# Patient Record
Sex: Female | Born: 1965 | Race: White | Hispanic: No | Marital: Single | Smoking: Never smoker
Health system: Southern US, Community
[De-identification: ages and names within clinical notes are randomized; demographics above are authoritative.]

## PROBLEM LIST (undated history)

## (undated) DIAGNOSIS — E119 Type 2 diabetes mellitus without complications: Secondary | ICD-10-CM

## (undated) DIAGNOSIS — F419 Anxiety disorder, unspecified: Secondary | ICD-10-CM

## (undated) HISTORY — PX: APPENDECTOMY: SHX54

## (undated) HISTORY — PX: ABDOMINAL HYSTERECTOMY: SHX81

## (undated) HISTORY — PX: TONSILLECTOMY: SUR1361

---

## 2004-02-24 ENCOUNTER — Emergency Department: Payer: Self-pay | Admitting: Internal Medicine

## 2004-03-10 ENCOUNTER — Ambulatory Visit: Payer: Self-pay | Admitting: Family Medicine

## 2004-04-04 ENCOUNTER — Inpatient Hospital Stay: Payer: Self-pay | Admitting: Obstetrics and Gynecology

## 2005-01-12 ENCOUNTER — Emergency Department: Payer: Self-pay | Admitting: Emergency Medicine

## 2005-07-06 ENCOUNTER — Emergency Department: Payer: Self-pay | Admitting: Emergency Medicine

## 2005-08-20 ENCOUNTER — Emergency Department: Payer: Self-pay | Admitting: Emergency Medicine

## 2006-10-17 ENCOUNTER — Emergency Department: Payer: Self-pay | Admitting: General Practice

## 2007-06-18 ENCOUNTER — Emergency Department: Payer: Self-pay | Admitting: Emergency Medicine

## 2007-09-11 ENCOUNTER — Emergency Department: Payer: Self-pay | Admitting: Emergency Medicine

## 2007-12-08 ENCOUNTER — Emergency Department: Payer: Self-pay | Admitting: Emergency Medicine

## 2008-08-01 DIAGNOSIS — R0989 Other specified symptoms and signs involving the circulatory and respiratory systems: Secondary | ICD-10-CM | POA: Insufficient documentation

## 2008-08-01 DIAGNOSIS — F411 Generalized anxiety disorder: Secondary | ICD-10-CM | POA: Insufficient documentation

## 2008-08-01 DIAGNOSIS — E669 Obesity, unspecified: Secondary | ICD-10-CM | POA: Insufficient documentation

## 2008-08-03 ENCOUNTER — Emergency Department: Payer: Self-pay | Admitting: Emergency Medicine

## 2009-03-15 ENCOUNTER — Emergency Department: Payer: Self-pay | Admitting: Emergency Medicine

## 2009-05-26 ENCOUNTER — Emergency Department: Payer: Self-pay | Admitting: Emergency Medicine

## 2010-03-17 ENCOUNTER — Emergency Department: Payer: Self-pay | Admitting: Emergency Medicine

## 2011-03-12 ENCOUNTER — Emergency Department: Payer: Self-pay | Admitting: *Deleted

## 2011-08-19 ENCOUNTER — Emergency Department: Payer: Self-pay | Admitting: Emergency Medicine

## 2011-08-19 LAB — TROPONIN I
Troponin-I: <0.02 ng/mL
Troponin-I: <0.02 ng/mL

## 2011-08-19 LAB — BASIC METABOLIC PANEL
BUN: 11 mg/dL (ref 7–18)
Calcium, Total: 8.3 mg/dL — ABNORMAL LOW (ref 8.5–10.1)
Chloride: 103 mmol/L (ref 98–107)
Co2: 30 mmol/L (ref 21–32)
Creatinine: 0.81 mg/dL (ref 0.60–1.30)
EGFR (African American): >60
Glucose: 87 mg/dL (ref 65–99)
Potassium: 3.8 mmol/L (ref 3.5–5.1)
Sodium: 142 mmol/L (ref 136–145)

## 2011-08-19 LAB — CBC
HGB: 13.2 g/dL (ref 12.0–16.0)
MCH: 31.8 pg (ref 26.0–34.0)
MCV: 95 fL (ref 80–100)
Platelet: 342 10*3/uL (ref 150–440)
RBC: 4.15 10*6/uL (ref 3.80–5.20)
RDW: 13.5 % (ref 11.5–14.5)
WBC: 8.4 10*3/uL (ref 3.6–11.0)

## 2013-01-13 ENCOUNTER — Emergency Department: Payer: Self-pay | Admitting: Emergency Medicine

## 2013-10-10 ENCOUNTER — Emergency Department: Payer: Self-pay | Admitting: Emergency Medicine

## 2013-10-13 LAB — BETA STREP CULTURE(ARMC)

## 2014-06-25 ENCOUNTER — Emergency Department: Payer: Self-pay | Admitting: Emergency Medicine

## 2014-08-11 ENCOUNTER — Emergency Department
Admit: 2014-08-11 | Disposition: A | Discharge status: Home / Self Care | Payer: Self-pay | Admitting: Emergency Medicine

## 2014-08-11 LAB — URINALYSIS, COMPLETE
Bilirubin,UR: NEGATIVE
GLUCOSE, UR: NEGATIVE mg/dL (ref 0–75)
Hyaline Cast: 2
Ketone: NEGATIVE
NITRITE: NEGATIVE
Ph: 5 (ref 4.5–8.0)
Protein: NEGATIVE
Specific Gravity: 1.02 (ref 1.003–1.030)
Squamous Epithelial: 26
WBC UR: 15 /HPF (ref 0–5)

## 2014-08-11 LAB — COMPREHENSIVE METABOLIC PANEL
ALBUMIN: 3.8 g/dL
ALK PHOS: 69 U/L
Anion Gap: 10 (ref 7–16)
BUN: 8 mg/dL
Bilirubin,Total: 0.3 mg/dL
CREATININE: 0.82 mg/dL
Calcium, Total: 8.5 mg/dL — ABNORMAL LOW
Chloride: 103 mmol/L
Co2: 26 mmol/L
EGFR (African American): >60
Glucose: 106 mg/dL — ABNORMAL HIGH
POTASSIUM: 3.7 mmol/L
SGOT(AST): 36 U/L
SGPT (ALT): 31 U/L
Sodium: 139 mmol/L
Total Protein: 7.3 g/dL

## 2014-08-11 LAB — CBC WITH DIFFERENTIAL/PLATELET
Basophil #: 0 10*3/uL (ref 0.0–0.1)
Basophil %: 0.5 %
Eosinophil #: 0.1 10*3/uL (ref 0.0–0.7)
Eosinophil %: 1.9 %
HCT: 42.5 % (ref 35.0–47.0)
HGB: 13.6 g/dL (ref 12.0–16.0)
LYMPHS PCT: 33.2 %
Lymphocyte #: 2.5 10*3/uL (ref 1.0–3.6)
MCH: 30.4 pg (ref 26.0–34.0)
MCHC: 31.9 g/dL — ABNORMAL LOW (ref 32.0–36.0)
MCV: 95 fL (ref 80–100)
MONOS PCT: 7.7 %
Monocyte #: 0.6 x10 3/mm (ref 0.2–0.9)
NEUTROS ABS: 4.3 10*3/uL (ref 1.4–6.5)
Neutrophil %: 56.7 %
Platelet: 391 10*3/uL (ref 150–440)
RBC: 4.46 10*6/uL (ref 3.80–5.20)
RDW: 13.8 % (ref 11.5–14.5)
WBC: 7.5 10*3/uL (ref 3.6–11.0)

## 2014-08-11 LAB — LIPASE, BLOOD: LIPASE: 53 U/L — AB

## 2014-08-11 LAB — TROPONIN I: Troponin-I: <0.03 ng/mL

## 2014-09-02 ENCOUNTER — Emergency Department
Admit: 2014-09-02 | Disposition: A | Discharge status: Home / Self Care | Payer: Self-pay | Admitting: Emergency Medicine

## 2015-01-30 ENCOUNTER — Encounter: Payer: Self-pay | Admitting: Emergency Medicine

## 2015-01-30 ENCOUNTER — Emergency Department
Admission: EM | Admit: 2015-01-30 | Discharge: 2015-01-30 | Disposition: A | Discharge status: Home / Self Care | Payer: BLUE CROSS/BLUE SHIELD | Attending: Emergency Medicine | Admitting: Emergency Medicine

## 2015-01-30 DIAGNOSIS — R002 Palpitations: Secondary | ICD-10-CM | POA: Insufficient documentation

## 2015-01-30 HISTORY — DX: Anxiety disorder, unspecified: F41.9

## 2015-01-30 LAB — COMPREHENSIVE METABOLIC PANEL
ALBUMIN: 3.6 g/dL (ref 3.5–5.0)
ALK PHOS: 62 U/L (ref 38–126)
ALT: 24 U/L (ref 14–54)
ANION GAP: 5 (ref 5–15)
AST: 28 U/L (ref 15–41)
BUN: 12 mg/dL (ref 6–20)
CALCIUM: 8.4 mg/dL — AB (ref 8.9–10.3)
CHLORIDE: 105 mmol/L (ref 101–111)
CO2: 28 mmol/L (ref 22–32)
Creatinine, Ser: 0.62 mg/dL (ref 0.44–1.00)
GFR calc non Af Amer: >60 mL/min (ref >60)
Glucose, Bld: 124 mg/dL — ABNORMAL HIGH (ref 65–99)
POTASSIUM: 3.7 mmol/L (ref 3.5–5.1)
Sodium: 138 mmol/L (ref 135–145)
Total Bilirubin: 0.3 mg/dL (ref 0.3–1.2)
Total Protein: 6.7 g/dL (ref 6.5–8.1)

## 2015-01-30 LAB — CBC
HCT: 37.5 % (ref 35.0–47.0)
Hemoglobin: 12.8 g/dL (ref 12.0–16.0)
MCH: 32 pg (ref 26.0–34.0)
MCHC: 34 g/dL (ref 32.0–36.0)
MCV: 94 fL (ref 80.0–100.0)
PLATELETS: 356 10*3/uL (ref 150–440)
RBC: 3.99 MIL/uL (ref 3.80–5.20)
RDW: 13.6 % (ref 11.5–14.5)
WBC: 12 10*3/uL — ABNORMAL HIGH (ref 3.6–11.0)

## 2015-01-30 LAB — TROPONIN I: Troponin I: <0.03 ng/mL (ref <0.031)

## 2015-01-30 NOTE — Discharge Instructions (Signed)
Please seek medical attention for any high fevers, chest pain, shortness of breath, change in behavior, persistent vomiting, bloody stool or any other new or concerning symptoms.   Palpitations A palpitation is the feeling that your heartbeat is irregular or is faster than normal. It may feel like your heart is fluttering or skipping a beat. Palpitations are usually not a serious problem. However, in some cases, you may need further medical evaluation. CAUSES  Palpitations can be caused by:  Smoking.  Caffeine or other stimulants, such as diet pills or energy drinks.  Alcohol.  Stress and anxiety.  Strenuous physical activity.  Fatigue.  Certain medicines.  Heart disease, especially if you have a history of irregular heart rhythms (arrhythmias), such as atrial fibrillation, atrial flutter, or supraventricular tachycardia.  An improperly working pacemaker or defibrillator. DIAGNOSIS  To find the cause of your palpitations, your health care provider will take your medical history and perform a physical exam. Your health care provider may also have you take a test called an ambulatory electrocardiogram (ECG). An ECG records your heartbeat patterns over a 24-hour period. You may also have other tests, such as:  Transthoracic echocardiogram (TTE). During echocardiography, sound waves are used to evaluate how blood flows through your heart.  Transesophageal echocardiogram (TEE).  Cardiac monitoring. This allows your health care provider to monitor your heart rate and rhythm in real time.  Holter monitor. This is a portable device that records your heartbeat and can help diagnose heart arrhythmias. It allows your health care provider to track your heart activity for several days, if needed.  Stress tests by exercise or by giving medicine that makes the heart beat faster. TREATMENT  Treatment of palpitations depends on the cause of your symptoms and can vary greatly. Most cases of  palpitations do not require any treatment other than time, relaxation, and monitoring your symptoms. Other causes, such as atrial fibrillation, atrial flutter, or supraventricular tachycardia, usually require further treatment. HOME CARE INSTRUCTIONS   Avoid:  Caffeinated coffee, tea, soft drinks, diet pills, and energy drinks.  Chocolate.  Alcohol.  Stop smoking if you smoke.  Reduce your stress and anxiety. Things that can help you relax include:  A method of controlling things in your body, such as your heartbeats, with your mind (biofeedback).  Yoga.  Meditation.  Physical activity such as swimming, jogging, or walking.  Get plenty of rest and sleep. SEEK MEDICAL CARE IF:   You continue to have a fast or irregular heartbeat beyond 24 hours.  Your palpitations occur more often. SEEK IMMEDIATE MEDICAL CARE IF:  You have chest pain or shortness of breath.  You have a severe headache.  You feel dizzy or you faint. MAKE SURE YOU:  Understand these instructions.  Will watch your condition.  Will get help right away if you are not doing well or get worse. Document Released: 04/21/2000 Document Revised: 04/29/2013 Document Reviewed: 06/23/2011 [facility] Patient Information 2015 [place], [place]. This information is not intended to replace advice given to you by your health care provider. Make sure you discuss any questions you have with your health care provider.

## 2015-01-30 NOTE — ED Notes (Signed)
States sensation of fluttering in chest x 1 week, denies chest pain, states intermittent SOB and dizziness

## 2015-01-30 NOTE — ED Provider Notes (Signed)
[facility] Emergency Department Provider Note    ____________________________________________  Time seen: 1655  I have reviewed the triage vital signs and the nursing notes.   HISTORY  Chief Complaint Palpitations   History limited by: Not Limited   HPI Teresa Newman is a 49 y.o. female who presents to the emergency department today because of concerns for fluttering in her chest. She states the symptoms have been going on for 1 week. She states that the sensation is a fluttering. It sometimes goes up into her neck. It only lasts 2 or 3 seconds. She does not have any associated chest pain. No inciting or alleviating factors that the patient is been able to identify. Patient states she has never had symptoms like this in the past. She denies any recent illnesses, fevers, nausea or vomiting.    Past Medical History  Diagnosis Date  . Anxiety disorder     There are no active problems to display for this patient.   Past Surgical History  Procedure Laterality Date  . Appendectomy    . Tonsillectomy    . Abdominal hysterectomy      No current outpatient prescriptions on file.  Allergies Review of patient's allergies indicates no known allergies.  No family history on file.  Social History Social History  Substance Use Topics  . Smoking status: Never Smoker   . Smokeless tobacco: None  . Alcohol Use: No    Review of Systems  Constitutional: Negative for fever. Cardiovascular: Negative for chest pain.positive for heart fluttering Respiratory: Negative for shortness of breath. Gastrointestinal: Negative for abdominal pain, vomiting and diarrhea. Genitourinary: Negative for dysuria. Musculoskeletal: Negative for back pain. Skin: Negative for rash. Neurological: Negative for headaches, focal weakness or numbness.  10-point ROS otherwise negative.  ____________________________________________   PHYSICAL EXAM:  VITAL SIGNS: ED  Triage Vitals  Enc Vitals Group     BP 01/30/15 1434 134/64 mmHg     Pulse Rate 01/30/15 1434 82     Resp 01/30/15 1434 18     Temp 01/30/15 1434 97.9 F (36.6 C)     Temp Source 01/30/15 1434 Oral     SpO2 01/30/15 1434 99 %     Weight 01/30/15 1434 240 lb (108.863 kg)     Height 01/30/15 1434  (1.803 m)   Constitutional: Alert and oriented. Well appearing and in no distress. Eyes: Conjunctivae are normal. PERRL. Normal extraocular movements. ENT   Head: Normocephalic and atraumatic.   Nose: No congestion/rhinnorhea.   Mouth/Throat: Mucous membranes are moist.   Neck: No stridor. Hematological/Lymphatic/Immunilogical: No cervical lymphadenopathy. Cardiovascular: Normal rate, regular rhythm.  No murmurs, rubs, or gallops. Respiratory: Normal respiratory effort without tachypnea nor retractions. Breath sounds are clear and equal bilaterally. No wheezes/rales/rhonchi. Gastrointestinal: Soft and nontender. No distention. Genitourinary: Deferred Musculoskeletal: Normal range of motion in all extremities.  Neurologic:  Normal speech and language. No gross focal neurologic deficits are appreciated. Speech is normal.  Skin:  Skin is warm, dry and intact. No rash noted. Psychiatric: Mood and affect are normal. Speech and behavior are normal. Patient exhibits appropriate insight and judgment.  ____________________________________________    LABS (pertinent positives/negatives)  Labs Reviewed  CBC - Abnormal; Notable for the following:    WBC 12.0 (*)    All other components within normal limits  COMPREHENSIVE METABOLIC PANEL - Abnormal; Notable for the following:    Glucose, Bld 124 (*)    Calcium 8.4 (*)    All other components  within normal limits  TROPONIN I     ____________________________________________   EKG  I, Phineas Semen, attending physician, personally viewed and interpreted this EKG  EKG Time: 1438 Rate: 81 Rhythm: normal sinus  rhythm Axis: normal Intervals: normal QRS: normal ST changes: no st elevation Impression: Normal EKG   ____________________________________________    RADIOLOGY  None   ____________________________________________   PROCEDURES  Procedure(s) performed: None  Critical Care performed: No  ____________________________________________   INITIAL IMPRESSION / ASSESSMENT AND PLAN / ED COURSE  Pertinent labs & imaging results that were available during my care of the patient were reviewed by me and considered in my medical decision making (see chart for details).  Patient presented to the emergency department today with concerns for heart fluttering and palpitations. EKG without any concerning findings. Additionally blood work without concerning findings. Physical exam without any murmurs. This point unclear etiology patient's symptoms however I feel like PVCs most likely given the very brief nature. I think paroxysmal A. Fib is less likely, however I did discuss with the patient the importance of following up with primary care cardiology for possible Holter monitor. I also discussed return precautions with the patient.  ____________________________________________   FINAL CLINICAL IMPRESSION(S) / ED DIAGNOSES  palpitations  Phineas Semen, MD 01/30/15 [phone]

## 2015-06-02 ENCOUNTER — Encounter: Payer: Self-pay | Admitting: *Deleted

## 2015-06-02 ENCOUNTER — Emergency Department
Admission: EM | Admit: 2015-06-02 | Discharge: 2015-06-02 | Disposition: A | Discharge status: Home / Self Care | Payer: BLUE CROSS/BLUE SHIELD | Attending: Emergency Medicine | Admitting: Emergency Medicine

## 2015-06-02 DIAGNOSIS — Z79899 Other long term (current) drug therapy: Secondary | ICD-10-CM | POA: Diagnosis not present

## 2015-06-02 DIAGNOSIS — R197 Diarrhea, unspecified: Secondary | ICD-10-CM | POA: Insufficient documentation

## 2015-06-02 DIAGNOSIS — N3090 Cystitis, unspecified without hematuria: Secondary | ICD-10-CM | POA: Diagnosis not present

## 2015-06-02 DIAGNOSIS — R1030 Lower abdominal pain, unspecified: Secondary | ICD-10-CM | POA: Diagnosis present

## 2015-06-02 LAB — COMPREHENSIVE METABOLIC PANEL
ALT: 30 U/L (ref 14–54)
AST: 23 U/L (ref 15–41)
Albumin: 3.9 g/dL (ref 3.5–5.0)
Alkaline Phosphatase: 67 U/L (ref 38–126)
Anion gap: 9 (ref 5–15)
BILIRUBIN TOTAL: 0.8 mg/dL (ref 0.3–1.2)
BUN: 8 mg/dL (ref 6–20)
CO2: 28 mmol/L (ref 22–32)
Calcium: 9 mg/dL (ref 8.9–10.3)
Chloride: 103 mmol/L (ref 101–111)
Creatinine, Ser: 0.76 mg/dL (ref 0.44–1.00)
GFR calc Af Amer: >60 mL/min (ref >60)
Glucose, Bld: 103 mg/dL — ABNORMAL HIGH (ref 65–99)
POTASSIUM: 3.6 mmol/L (ref 3.5–5.1)
Sodium: 140 mmol/L (ref 135–145)
TOTAL PROTEIN: 7.5 g/dL (ref 6.5–8.1)

## 2015-06-02 LAB — CBC
HEMATOCRIT: 41 % (ref 35.0–47.0)
Hemoglobin: 13.4 g/dL (ref 12.0–16.0)
MCH: 31.1 pg (ref 26.0–34.0)
MCHC: 32.7 g/dL (ref 32.0–36.0)
MCV: 95 fL (ref 80.0–100.0)
Platelets: 353 10*3/uL (ref 150–440)
RBC: 4.31 MIL/uL (ref 3.80–5.20)
RDW: 13.7 % (ref 11.5–14.5)
WBC: 14.9 10*3/uL — AB (ref 3.6–11.0)

## 2015-06-02 LAB — URINALYSIS COMPLETE WITH MICROSCOPIC (ARMC ONLY)
BACTERIA UA: NONE SEEN
Bilirubin Urine: NEGATIVE
Glucose, UA: NEGATIVE mg/dL
HGB URINE DIPSTICK: NEGATIVE
Ketones, ur: NEGATIVE mg/dL
NITRITE: NEGATIVE
PH: 6 (ref 5.0–8.0)
Protein, ur: NEGATIVE mg/dL
Specific Gravity, Urine: 1.017 (ref 1.005–1.030)

## 2015-06-02 LAB — LIPASE, BLOOD: LIPASE: 35 U/L (ref 11–51)

## 2015-06-02 MED ORDER — ONDANSETRON 8 MG PO TBDP: 8.0000 mg | ORAL_TABLET | Freq: Three times a day (TID) | ORAL | Status: DC | PRN

## 2015-06-02 MED ORDER — DIPHENHYDRAMINE HCL 25 MG PO CAPS
ORAL_CAPSULE | ORAL | Status: AC
  Administered 2015-06-02: 25 mg via ORAL
  Filled 2015-06-02: qty 1

## 2015-06-02 MED ORDER — METOCLOPRAMIDE HCL 10 MG PO TABS
10.0000 mg | ORAL_TABLET | Freq: Once | ORAL | Status: AC
  Administered 2015-06-02: 10 mg via ORAL
  Filled 2015-06-02: qty 1

## 2015-06-02 MED ORDER — CEPHALEXIN 500 MG PO CAPS: 500.0000 mg | ORAL_CAPSULE | Freq: Three times a day (TID) | ORAL | Status: DC

## 2015-06-02 MED ORDER — DIPHENHYDRAMINE HCL 25 MG PO CAPS
25.0000 mg | ORAL_CAPSULE | Freq: Once | ORAL | Status: AC
  Administered 2015-06-02: 25 mg via ORAL

## 2015-06-02 NOTE — Discharge Instructions (Signed)

## 2015-06-02 NOTE — ED Notes (Signed)
Pt to triage via wheelchair.  Pt has abd pain for 2 days.  No n/v/d.  Pt reports back pain.  No dysuria.  No vag bleeding/discharge.  Pt alert.

## 2015-06-02 NOTE — ED Provider Notes (Signed)
[facility] Emergency Department Provider Note  ____________________________________________  Time seen: 5:35 PM  I have reviewed the triage vital signs and the nursing notes.   HISTORY  Chief Complaint Abdominal Pain    HPI Teresa Newman is a 50 y.o. female who complains of lower abdominal pain with nausea chills urinary frequency dysuria and urgency for the past 2 days. No back pain. No trauma. Normal oral intake.     Past Medical History  Diagnosis Date  . Anxiety disorder      There are no active problems to display for this patient.    Past Surgical History  Procedure Laterality Date  . Appendectomy    . Tonsillectomy    . Abdominal hysterectomy       Current Outpatient Rx  Name  Route  Sig  Dispense  Refill  . cephALEXin (KEFLEX) 500 MG capsule   Oral   Take 1 capsule (500 mg total) by mouth 3 (three) times daily.   21 capsule   0   . ondansetron (ZOFRAN ODT) 8 MG disintegrating tablet   Oral   Take 1 tablet (8 mg total) by mouth every 8 (eight) hours as needed for nausea or vomiting.   20 tablet   0   . PARoxetine (PAXIL) 40 MG tablet   Oral   Take 40 mg by mouth daily.            Allergies Review of patient's allergies indicates no known allergies.   No family history on file.  Social History Social History  Substance Use Topics  . Smoking status: Never Smoker   . Smokeless tobacco: None  . Alcohol Use: No    Review of Systems  Constitutional:   No fever or chills. No weight changes Eyes:   No blurry vision or double vision.  ENT:   No sore throat. Cardiovascular:   No chest pain. Respiratory:   No dyspnea or cough. Gastrointestinal:   Positive as above for abdominal pain, vomiting and diarrhea.  No BRBPR or melena. Genitourinary:   Positive as above dysuria with urgency and frequency. No Hematuria. Musculoskeletal:   Negative for back pain. No joint swelling or pain. Skin:   Negative for  rash. Neurological:   Negative for headaches, focal weakness or numbness. Psychiatric:  No anxiety or depression.   Endocrine:  No hot/cold intolerance, changes in energy, or sleep difficulty.  10-point ROS otherwise negative.  ____________________________________________   PHYSICAL EXAM:  VITAL SIGNS: ED Triage Vitals  Enc Vitals Group     BP 06/02/15 1641 124/64 mmHg     Pulse Rate 06/02/15 1641 75     Resp 06/02/15 1641 20     Temp 06/02/15 1641 97.9 F (36.6 C)     Temp Source 06/02/15 1641 Oral     SpO2 06/02/15 1641 98 %     Weight 06/02/15 1641 241 lb (109.317 kg)     Height 06/02/15 1641  (1.803 m)     Head Cir --      Peak Flow --      Pain Score 06/02/15 1642 10     Pain Loc --      Pain Edu? --      Excl. in GC? --     Vital signs reviewed, nursing assessments reviewed.   Constitutional:   Alert and oriented. Well appearing and in no distress. Eyes:   No scleral icterus. No conjunctival pallor. PERRL. EOMI ENT   Head:  Normocephalic and atraumatic.   Nose:   No congestion/rhinnorhea. No septal hematoma   Mouth/Throat:   MMM, no pharyngeal erythema. No peritonsillar mass. No uvula shift.   Neck:   No stridor. No SubQ emphysema. No meningismus. Hematological/Lymphatic/Immunilogical:   No cervical lymphadenopathy. Cardiovascular:   RRR. Normal and symmetric distal pulses are present in all extremities. No murmurs, rubs, or gallops. Respiratory:   Normal respiratory effort without tachypnea nor retractions. Breath sounds are clear and equal bilaterally. No wheezes/rales/rhonchi. Gastrointestinal:   Soft with suprapubic tenderness. No distention. There is no CVA tenderness.  No rebound, rigidity, or guarding. Genitourinary:   deferred Musculoskeletal:   Nontender with normal range of motion in all extremities. No joint effusions.  No lower extremity tenderness.  No edema. Neurologic:   Normal speech and language.  CN 2-10 normal. Motor  grossly intact. No pronator drift.  Normal gait. No gross focal neurologic deficits are appreciated.  Skin:    Skin is warm, dry and intact. No rash noted.  No petechiae, purpura, or bullae. Psychiatric:   Mood and affect are normal. Speech and behavior are normal. Patient exhibits appropriate insight and judgment.  ____________________________________________    LABS (pertinent positives/negatives) (all labs ordered are listed, but only abnormal results are displayed) Labs Reviewed  COMPREHENSIVE METABOLIC PANEL - Abnormal; Notable for the following:    Glucose, Bld 103 (*)    All other components within normal limits  CBC - Abnormal; Notable for the following:    WBC 14.9 (*)    All other components within normal limits  URINALYSIS COMPLETEWITH MICROSCOPIC ([facility] ONLY) - Abnormal; Notable for the following:    Color, Urine YELLOW (*)    APPearance HAZY (*)    Leukocytes, UA 2+ (*)    Squamous Epithelial / LPF 6-30 (*)    All other components within normal limits  URINE CULTURE  LIPASE, BLOOD   ____________________________________________   EKG    ____________________________________________    RADIOLOGY    ____________________________________________   PROCEDURES   ____________________________________________   INITIAL IMPRESSION / ASSESSMENT AND PLAN / ED COURSE  Pertinent labs & imaging results that were available during my care of the patient were reviewed by me and considered in my medical decision making (see chart for details).  She presents with symptoms of cystitis. Exam is consistent with this as well. Highly suspect urinary tract infection. However, labs are essentially unremarkable except for leukocytes in the urine and an elevated serum white blood cell count of almost 15,000. Low suspicion for appendicitis obstruction abscess or perforation. No evidence of biliary pathology. Low suspicion for GU pathology such as STI TOA or PID. Given the highly  suggestive history and physical exam, we'll go ahead and treat the patient with Keflex and send a urine culture. Follow up with primary care.     ____________________________________________   FINAL CLINICAL IMPRESSION(S) / ED DIAGNOSES  Final diagnoses:  Cystitis      Sharman Cheek, MD 06/02/15 [phone]

## 2015-06-02 NOTE — ED Notes (Addendum)
Patient complaining of pressure in abdomen, nausea, cold chills and frequent urination for 2 days. States she vomited once yesterday. A&O x4. Patient in no apparent distress.

## 2015-06-04 LAB — URINE CULTURE

## 2015-07-05 DIAGNOSIS — M719 Bursopathy, unspecified: Secondary | ICD-10-CM | POA: Insufficient documentation

## 2015-10-13 ENCOUNTER — Encounter: Payer: Self-pay | Admitting: Emergency Medicine

## 2015-10-13 ENCOUNTER — Emergency Department: Payer: Self-pay

## 2015-10-13 ENCOUNTER — Emergency Department
Admission: EM | Admit: 2015-10-13 | Discharge: 2015-10-13 | Disposition: A | Discharge status: Home / Self Care | Payer: Self-pay | Attending: Emergency Medicine | Admitting: Emergency Medicine

## 2015-10-13 DIAGNOSIS — S39012A Strain of muscle, fascia and tendon of lower back, initial encounter: Secondary | ICD-10-CM | POA: Insufficient documentation

## 2015-10-13 DIAGNOSIS — Y939 Activity, unspecified: Secondary | ICD-10-CM | POA: Insufficient documentation

## 2015-10-13 DIAGNOSIS — X501XXA Overexertion from prolonged static or awkward postures, initial encounter: Secondary | ICD-10-CM | POA: Insufficient documentation

## 2015-10-13 DIAGNOSIS — Y999 Unspecified external cause status: Secondary | ICD-10-CM | POA: Insufficient documentation

## 2015-10-13 DIAGNOSIS — Y929 Unspecified place or not applicable: Secondary | ICD-10-CM | POA: Insufficient documentation

## 2015-10-13 DIAGNOSIS — S29019A Strain of muscle and tendon of unspecified wall of thorax, initial encounter: Secondary | ICD-10-CM

## 2015-10-13 DIAGNOSIS — M7122 Synovial cyst of popliteal space [Baker], left knee: Secondary | ICD-10-CM

## 2015-10-13 MED ORDER — HYDROMORPHONE HCL 1 MG/ML IJ SOLN
1.0000 mg | Freq: Once | INTRAMUSCULAR | Status: AC
  Administered 2015-10-13: 1 mg via INTRAMUSCULAR
  Filled 2015-10-13: qty 1

## 2015-10-13 MED ORDER — KETOROLAC TROMETHAMINE 60 MG/2ML IM SOLN
60.0000 mg | Freq: Once | INTRAMUSCULAR | Status: AC
  Administered 2015-10-13: 60 mg via INTRAMUSCULAR
  Filled 2015-10-13: qty 2

## 2015-10-13 MED ORDER — NAPROXEN 500 MG PO TABS: 500.0000 mg | ORAL_TABLET | Freq: Two times a day (BID) | ORAL | Status: DC

## 2015-10-13 MED ORDER — TRAMADOL HCL 50 MG PO TABS: 50.0000 mg | ORAL_TABLET | Freq: Four times a day (QID) | ORAL | Status: AC | PRN

## 2015-10-13 NOTE — ED Notes (Signed)
Patient presents to the ED with left knee pain and right upper back pain x 1.5 weeks.  Patient states, "I keep hoping it will get better on it's own but it hasn't.  Patient denies history of similar pain.  Patient denies injury.  Patient states, "I've been crying at work it hurts so bad."  Patient states standing and walking increases pain.

## 2015-10-13 NOTE — Discharge Instructions (Signed)
Baker Cyst °A Baker cyst is a sac-like structure that forms in the back of the knee. It is filled with the same fluid that is located in your knee. This fluid lubricates the bones and cartilage of the knee and allows them to move over each other more easily. °CAUSES  °When the knee becomes injured or inflamed, increased fluid forms in the knee. When this happens, the joint lining is pushed out behind the knee and forms the Baker cyst. This cyst may also be caused by inflammation from arthritic conditions and infections. °SIGNS AND SYMPTOMS  °A Baker cyst usually has no symptoms. When the cyst is substantially enlarged: °· You may feel pressure behind the knee, stiffness in the knee, or a mass in the area behind the knee. °· You may develop pain, redness, and swelling in the calf.  This can suggest a blood clot and requires evaluation by your health care provider. °DIAGNOSIS  °A Baker cyst is most often found during an ultrasound exam. This exam may have been performed for other reasons, and the cyst was found incidentally. Sometimes an MRI is used. This picks up other problems within a joint that an ultrasound exam may not. If the Baker cyst developed immediately after an injury, X-ray exams may be used to diagnose the cyst. °TREATMENT  °The treatment depends on the cause of the cyst. Anti-inflammatory medicines and rest often will be prescribed. If the cyst is caused by a bacterial infection, antibiotic medicines may be prescribed.  °HOME CARE INSTRUCTIONS  °· If the cyst was caused by an injury, for the first 24 hours, keep the injured leg elevated on 2 pillows while lying down. °· For the first 24 hours while you are awake, apply ice to the injured area: °¨ Put ice in a plastic bag. °¨ Place a towel between your skin and the bag. °¨ Leave the ice on for 20 minutes, 2-3 times a day. °· Only take over-the-counter or prescription medicines for pain, discomfort, or fever as directed by your health care  provider. °· Only take antibiotic medicine as directed. Make sure to finish it even if you start to feel better. °MAKE SURE YOU:  °· Understand these instructions. °· Will watch your condition. °· Will get help right away if you are not doing well or get worse. °  °This information is not intended to replace advice given to you by your health care provider. Make sure you discuss any questions you have with your health care provider. °  °Document Released: 04/24/2005 Document Revised: 02/12/2013 Document Reviewed: 12/04/2012 °[employer] Interactive Patient Education ©2016 [employer] ° °

## 2015-10-13 NOTE — ED Notes (Signed)
Discharge instructions reviewed with patient. Patient verbalized understanding. Patient taken to lobby via wheelchair by family 

## 2015-10-13 NOTE — ED Provider Notes (Signed)
[facility] Emergency Department Provider Note   ____________________________________________  Time seen: Approximately 4:30 PM  I have reviewed the triage vital signs and the nursing notes.   HISTORY  Chief Complaint Back Pain and Knee Pain    HPI Teresa Newman is a 50 y.o. female patient complains of 2 weeks of increasing low back pain. Patient denies any radicular component to this pain. Patient denies any bladder or bowel dysfunction. Patient also complaining of 1 week of posterior and medial left knee pain. Patient denies any provocative incident for either of these complaints. Patient state prolonged standing and walking increases her pain. No palliative measures taken for this complaint. Patient rates her pain discomfort as 8/10.  Past Medical History  Diagnosis Date  . Anxiety disorder     There are no active problems to display for this patient.   Past Surgical History  Procedure Laterality Date  . Appendectomy    . Tonsillectomy    . Abdominal hysterectomy      Current Outpatient Rx  Name  Route  Sig  Dispense  Refill  . cephALEXin (KEFLEX) 500 MG capsule   Oral   Take 1 capsule (500 mg total) by mouth 3 (three) times daily.   21 capsule   0   . naproxen (NAPROSYN) 500 MG tablet   Oral   Take 1 tablet (500 mg total) by mouth 2 (two) times daily with a meal.   20 tablet   0   . ondansetron (ZOFRAN ODT) 8 MG disintegrating tablet   Oral   Take 1 tablet (8 mg total) by mouth every 8 (eight) hours as needed for nausea or vomiting.   20 tablet   0   . PARoxetine (PAXIL) 40 MG tablet   Oral   Take 40 mg by mouth daily.         . traMADol (ULTRAM) 50 MG tablet   Oral   Take 1 tablet (50 mg total) by mouth every 6 (six) hours as needed.   20 tablet   0     Allergies Review of patient's allergies indicates no known allergies.  No family history on file.  Social History Social History  Substance Use Topics  .  Smoking status: Never Smoker   . Smokeless tobacco: None  . Alcohol Use: No    Review of Systems Constitutional: No fever/chills Eyes: No visual changes. ENT: No sore throat. Cardiovascular: Denies chest pain. Respiratory: Denies shortness of breath. Gastrointestinal: No abdominal pain.  No nausea, no vomiting.  No diarrhea.  No constipation. Genitourinary: Negative for dysuria. Musculoskeletal: Positive for low back and right knee pain. Skin: Negative for rash. Neurological: Negative for headaches, focal weakness or numbness. Psychiatric:Anxiety  ____________________________________________   PHYSICAL EXAM:  VITAL SIGNS: ED Triage Vitals  Enc Vitals Group     BP 10/13/15 1615 131/78 mmHg     Pulse Rate 10/13/15 1615 81     Resp 10/13/15 1615 18     Temp 10/13/15 1615 97.7 F (36.5 C)     Temp Source 10/13/15 1615 Oral     SpO2 10/13/15 1615 97 %     Weight 10/13/15 1615 246 lb (111.585 kg)     Height 10/13/15 1615  (1.778 m)     Head Cir --      Peak Flow --      Pain Score 10/13/15 1616 8     Pain Loc --      Pain Edu? --  Excl. in [place]? --     Constitutional: Alert and oriented. Well appearing and in no acute distress.Morbid obesity Eyes: Conjunctivae are normal. PERRL. EOMI. Head: Atraumatic. Nose: No congestion/rhinnorhea. Mouth/Throat: Mucous membranes are moist.  Oropharynx non-erythematous. Neck: No stridor.  No cervical spine tenderness to palpation. Hematological/Lymphatic/Immunilogical: No cervical lymphadenopathy. Cardiovascular: Normal rate, regular rhythm. Grossly normal heart sounds.  Good peripheral circulation. Respiratory: Normal respiratory effort.  No retractions. Lungs CTAB. Gastrointestinal: Soft and nontender. No distention. No abdominal bruits. No CVA tenderness. Musculoskeletal: No obvious spinal deformity. Patient has some moderate guarding palpation L3-S1. Patient has a lateral paraspinal muscle spasms. Patient has negative  straight leg test. Straight-leg test. Examination of the left knee reveals no obvious deformity edema or erythema Neurologic:  Normal speech and language. No gross focal neurologic deficits are appreciated. No gait instability. Skin:  Skin is warm, dry and intact. No rash noted. Psychiatric: Mood and affect are normal. Speech and behavior are normal.  ____________________________________________   LABS (all labs ordered are listed, but only abnormal results are displayed)  Labs Reviewed - No data to display ____________________________________________  EKG   ____________________________________________  RADIOLOGY  No acute findings on x-ray of the thoracic spine except for some mild spurring. No acute findings x-ray of the left knee. Mild DJD T changes and also incidental finding of a Baker cyst. ____________________________________________   PROCEDURES  Procedure(s) performed: None  Critical Care performed: No  ____________________________________________   INITIAL IMPRESSION / ASSESSMENT AND PLAN / ED COURSE  Pertinent labs & imaging results that were available during my care of the patient were reviewed by me and considered in my medical decision making (see chart for details).  Myofascial pain and upper back. Baker's cyst left knee. Patient given discharge care instructions. Patient advised follow orthopedics for further evaluation and treatment. Patient given a prescription for tramadol and naproxen. ____________________________________________   FINAL CLINICAL IMPRESSION(S) / ED DIAGNOSES  Final diagnoses:  Thoracic myofascial strain, initial encounter  Baker's cyst of knee, left      NEW MEDICATIONS STARTED DURING THIS VISIT:  New Prescriptions   NAPROXEN (NAPROSYN) 500 MG TABLET    Take 1 tablet (500 mg total) by mouth 2 (two) times daily with a meal.   TRAMADOL (ULTRAM) 50 MG TABLET    Take 1 tablet (50 mg total) by mouth every 6 (six) hours as needed.       Note:  This document was prepared using Dragon voice recognition software and may include unintentional dictation errors.    Joni Reiningonald K Elyjah Hazan, PA-C 10/13/15 1815  Joni Reiningonald K Shonika Kolasinski, PA-C 10/13/15 1817  Richardean Canalavid H Yao, MD 10/20/15 2127

## 2016-09-02 ENCOUNTER — Encounter: Payer: Self-pay | Admitting: Emergency Medicine

## 2016-09-02 DIAGNOSIS — R110 Nausea: Secondary | ICD-10-CM | POA: Insufficient documentation

## 2016-09-02 DIAGNOSIS — R51 Headache: Secondary | ICD-10-CM | POA: Insufficient documentation

## 2016-09-02 DIAGNOSIS — R509 Fever, unspecified: Secondary | ICD-10-CM | POA: Insufficient documentation

## 2016-09-02 DIAGNOSIS — Z79899 Other long term (current) drug therapy: Secondary | ICD-10-CM | POA: Insufficient documentation

## 2016-09-02 DIAGNOSIS — R05 Cough: Secondary | ICD-10-CM | POA: Insufficient documentation

## 2016-09-02 NOTE — ED Triage Notes (Signed)
Pt ambulatory to triage in NAD, report fever x 3 days, tx at home with ibuprofen, afebrile in triage.  Pt also c/o ha x 3 days with nausea, reports located front of head, described as pressure.

## 2016-09-03 ENCOUNTER — Emergency Department: Payer: BLUE CROSS/BLUE SHIELD

## 2016-09-03 ENCOUNTER — Emergency Department
Admission: EM | Admit: 2016-09-03 | Discharge: 2016-09-03 | Disposition: A | Discharge status: Home / Self Care | Payer: BLUE CROSS/BLUE SHIELD | Attending: Emergency Medicine | Admitting: Emergency Medicine

## 2016-09-03 DIAGNOSIS — R509 Fever, unspecified: Secondary | ICD-10-CM

## 2016-09-03 DIAGNOSIS — R519 Headache, unspecified: Secondary | ICD-10-CM

## 2016-09-03 DIAGNOSIS — R51 Headache: Secondary | ICD-10-CM

## 2016-09-03 LAB — BASIC METABOLIC PANEL
Anion gap: 7 (ref 5–15)
BUN: 11 mg/dL (ref 6–20)
CO2: 26 mmol/L (ref 22–32)
Calcium: 8.8 mg/dL — ABNORMAL LOW (ref 8.9–10.3)
Chloride: 104 mmol/L (ref 101–111)
Creatinine, Ser: 0.73 mg/dL (ref 0.44–1.00)
GFR calc Af Amer: >60 mL/min (ref >60)
GLUCOSE: 97 mg/dL (ref 65–99)
POTASSIUM: 4 mmol/L (ref 3.5–5.1)
Sodium: 137 mmol/L (ref 135–145)

## 2016-09-03 LAB — CBC
HCT: 38.9 % (ref 35.0–47.0)
Hemoglobin: 13 g/dL (ref 12.0–16.0)
MCH: 31.4 pg (ref 26.0–34.0)
MCHC: 33.4 g/dL (ref 32.0–36.0)
MCV: 93.9 fL (ref 80.0–100.0)
PLATELETS: 360 10*3/uL (ref 150–440)
RBC: 4.15 MIL/uL (ref 3.80–5.20)
RDW: 14 % (ref 11.5–14.5)
WBC: 12.5 10*3/uL — ABNORMAL HIGH (ref 3.6–11.0)

## 2016-09-03 LAB — T4, FREE: FREE T4: 0.9 ng/dL (ref 0.61–1.12)

## 2016-09-03 LAB — SEDIMENTATION RATE: Sed Rate: 19 mm/hr (ref 0–30)

## 2016-09-03 LAB — TROPONIN I: Troponin I: <0.03 ng/mL (ref <0.03)

## 2016-09-03 LAB — MAGNESIUM: Magnesium: 2 mg/dL (ref 1.7–2.4)

## 2016-09-03 LAB — TSH: TSH: 7.381 u[IU]/mL — AB (ref 0.350–4.500)

## 2016-09-03 MED ORDER — DIPHENHYDRAMINE HCL 50 MG/ML IJ SOLN
25.0000 mg | Freq: Once | INTRAMUSCULAR | Status: AC
  Administered 2016-09-03: 25 mg via INTRAVENOUS
  Filled 2016-09-03: qty 1

## 2016-09-03 MED ORDER — SODIUM CHLORIDE 0.9 % IV BOLUS (SEPSIS)
1000.0000 mL | Freq: Once | INTRAVENOUS | Status: AC
  Administered 2016-09-03: 1000 mL via INTRAVENOUS

## 2016-09-03 MED ORDER — METOCLOPRAMIDE HCL 5 MG/ML IJ SOLN
10.0000 mg | Freq: Once | INTRAMUSCULAR | Status: AC
  Administered 2016-09-03: 10 mg via INTRAVENOUS
  Filled 2016-09-03: qty 2

## 2016-09-03 MED ORDER — BUTALBITAL-APAP-CAFFEINE 50-325-40 MG PO TABS: 1.0000 | ORAL_TABLET | Freq: Four times a day (QID) | ORAL | 0 refills | Status: AC | PRN

## 2016-09-03 MED ORDER — KETOROLAC TROMETHAMINE 30 MG/ML IJ SOLN
30.0000 mg | Freq: Once | INTRAMUSCULAR | Status: AC
  Administered 2016-09-03: 30 mg via INTRAVENOUS
  Filled 2016-09-03: qty 1

## 2016-09-03 NOTE — ED Provider Notes (Signed)
[facility] Emergency Department Provider Note   ____________________________________________   First MD Initiated Contact with Patient 09/03/16 [phone]     (approximate)  I have reviewed the triage vital signs and the nursing notes.   HISTORY  Chief Complaint Headache    HPI Teresa Newman is a 51 y.o. female who comes into the hospital today with a headache for the past 3 days. She is also had some nausea and reports that she feels really bad. The patient has been taking ibuprofen but reports it has not helped. She states it initially started as a frontal headache but it is now left-sided. The patient has had occasional blurred vision but none currently. She denies any problems with headaches in the past. She did have a fever yesterday as well as today. She had some chills as well and a temperature to 100.2. The patient had a mild cough with no runny nose. She reports her headache is a 9 out of 10 in intensity currently. The patient's grandson is sick with a URI. The patient also reports that she's had some intermittent fluttering in her chest as well for the past 2 weeks and she was unsure what that was caused by. The patient is here today for evaluation.   Past Medical History:  Diagnosis Date  . Anxiety disorder     There are no active problems to display for this patient.   Past Surgical History:  Procedure Laterality Date  . ABDOMINAL HYSTERECTOMY    . APPENDECTOMY    . TONSILLECTOMY      Prior to Admission medications   Medication Sig Start Date End Date Taking? Authorizing Provider  butalbital-acetaminophen-caffeine (FIORICET, ESGIC) [phone] MG tablet Take 1-2 tablets by mouth every 6 (six) hours as needed for headache. 09/03/16 09/03/17  Loney Hering, MD  cephALEXin (KEFLEX) 500 MG capsule Take 1 capsule (500 mg total) by mouth 3 (three) times daily. 06/02/15   Carrie Mew, MD  naproxen (NAPROSYN) 500 MG tablet Take 1 tablet (500 mg  total) by mouth 2 (two) times daily with a meal. 10/13/15   Sable Feil, PA-C  ondansetron (ZOFRAN ODT) 8 MG disintegrating tablet Take 1 tablet (8 mg total) by mouth every 8 (eight) hours as needed for nausea or vomiting. 06/02/15   Carrie Mew, MD  PARoxetine (PAXIL) 40 MG tablet Take 40 mg by mouth daily.    Historical Provider, MD  traMADol (ULTRAM) 50 MG tablet Take 1 tablet (50 mg total) by mouth every 6 (six) hours as needed. 10/13/15 10/12/16  Sable Feil, PA-C    Allergies Patient has no known allergies.  History reviewed. No pertinent family history.  Social History Social History  Substance Use Topics  . Smoking status: Never Smoker  . Smokeless tobacco: Never Used  . Alcohol use No    Review of Systems  Constitutional:  fever/chills Eyes: No visual changes. ENT: No sore throat. Cardiovascular: Palpitations Respiratory: Cough Gastrointestinal: No abdominal pain.  No nausea, no vomiting.  No diarrhea.  No constipation. Genitourinary: Negative for dysuria. Musculoskeletal: Negative for back pain. Skin: Negative for rash. Neurological: Headache   ____________________________________________   PHYSICAL EXAM:  VITAL SIGNS: ED Triage Vitals  Enc Vitals Group     BP 09/02/16 2026 (!) 150/56     Pulse Rate 09/02/16 2026 74     Resp 09/02/16 2026 18     Temp 09/02/16 2026 98.2 F (36.8 C)     Temp Source 09/02/16 2026  Oral     SpO2 09/02/16 2026 95 %     Weight 09/02/16 2026 245 lb (111.1 kg)     Height 09/02/16 2026 '5\' 11"'  (1.803 m)     Head Circumference --      Peak Flow --      Pain Score 09/02/16 2030 8     Pain Loc --      Pain Edu? --      Excl. in [place]? --     Constitutional: Alert and oriented. Well appearing and in Moderate distress. Eyes: Conjunctivae are normal. PERRL. EOMI. Head: Atraumatic. Nose: No congestion/rhinnorhea. Mouth/Throat: Mucous membranes are moist.  Oropharynx non-erythematous. Neck: Neck is supple with no meningeal  signs. Cardiovascular: Normal rate, regular rhythm. Grossly normal heart sounds.  Good peripheral circulation. Respiratory: Normal respiratory effort.  No retractions. Lungs CTAB. Gastrointestinal: Soft and nontender. No distention. Positive bowel sounds Musculoskeletal: No lower extremity tenderness nor edema.  Neurologic:  Normal speech and language. Cranial nerves II through XII are grossly intact with no focal motor neuro deficits Skin:  Skin is warm, dry and intact. Psychiatric: Mood and affect are normal.   ____________________________________________   LABS (all labs ordered are listed, but only abnormal results are displayed)  Labs Reviewed  CBC - Abnormal; Notable for the following:       Result Value   WBC 12.5 (*)    All other components within normal limits  BASIC METABOLIC PANEL - Abnormal; Notable for the following:    Calcium 8.8 (*)    All other components within normal limits  TSH - Abnormal; Notable for the following:    TSH 7.381 (*)    All other components within normal limits  TROPONIN I  SEDIMENTATION RATE  MAGNESIUM  T4, FREE   ____________________________________________  EKG  none ____________________________________________  RADIOLOGY  CT head ____________________________________________   PROCEDURES  Procedure(s) performed: None  Procedures  Critical Care performed: No  ____________________________________________   INITIAL IMPRESSION / ASSESSMENT AND PLAN / ED COURSE  Pertinent labs & imaging results that were available during my care of the patient were reviewed by me and considered in my medical decision making (see chart for details).  This is a 51 year old female who comes into the hospital today with a headache with some nausea as well as aches, chills, cough and fevers. I did give the patient a liter of normal saline with some Reglan and Benadryl and Toradol. I also checked a CT scan of the patient's head. The CT is  unremarkable. The patient stated that she had been having some intermittent palpitations and her TSH was elevated but her free T4 was within the normal range. After giving the patient some medication she was sleeping comfortably and when I did go back in to evaluate her she said that she felt much better. The patient also had an ESR that was normal. I feel that the patient can be discharged home. I think she has a viral illness with her fever as well as her cough and aches and her symptoms. I informed the patient that I would like her to follow up with her primary care physician and she agrees with the plan. The patient will be discharged home to follow up. She has no further questions or concerns at this time.  Clinical Course as of Sep 03 356  Sun Sep 03, 2016  4098 Negative noncontrast CT HEAD. CT Head Wo Contrast [AW]    Clinical Course User Index [AW] Eduard Roux  Dahlia Client, MD     ____________________________________________   FINAL CLINICAL IMPRESSION(S) / ED DIAGNOSES  Final diagnoses:  Acute nonintractable headache, unspecified headache type  Fever, unspecified fever cause      NEW MEDICATIONS STARTED DURING THIS VISIT:  New Prescriptions   BUTALBITAL-ACETAMINOPHEN-CAFFEINE (FIORICET, ESGIC) 50-325-40 MG TABLET    Take 1-2 tablets by mouth every 6 (six) hours as needed for headache.     Note:  This document was prepared using Dragon voice recognition software and may include unintentional dictation errors.    Loney Hering, MD 09/03/16 [phone]

## 2016-09-03 NOTE — ED Notes (Signed)
Pt called to desk for a phone call; pt ambulatory with steady gait;

## 2016-09-03 NOTE — Discharge Instructions (Signed)
Your headache is improved at this time. I am unsure the cause of your headache but your blood work is unremarkable. Please follow-up. Thyroid levels as her TSH is elevated. Please return to the emergency department with any other concerns or complaints.

## 2016-09-03 NOTE — ED Notes (Signed)
Pt discharged to home.  Family member driving.  Discharge instructions reviewed.  Verbalized understanding.  No questions or concerns at this time.  Teach back verified.  Pt in NAD.  No items left in ED.   

## 2016-09-03 NOTE — ED Notes (Signed)
ED Provider at bedside. 

## 2017-02-04 ENCOUNTER — Emergency Department: Payer: BLUE CROSS/BLUE SHIELD

## 2017-02-04 ENCOUNTER — Emergency Department
Admission: EM | Admit: 2017-02-04 | Discharge: 2017-02-04 | Disposition: A | Discharge status: Home / Self Care | Payer: BLUE CROSS/BLUE SHIELD | Attending: Emergency Medicine | Admitting: Emergency Medicine

## 2017-02-04 DIAGNOSIS — Y929 Unspecified place or not applicable: Secondary | ICD-10-CM | POA: Insufficient documentation

## 2017-02-04 DIAGNOSIS — Y999 Unspecified external cause status: Secondary | ICD-10-CM | POA: Insufficient documentation

## 2017-02-04 DIAGNOSIS — M62830 Muscle spasm of back: Secondary | ICD-10-CM | POA: Insufficient documentation

## 2017-02-04 DIAGNOSIS — Z79899 Other long term (current) drug therapy: Secondary | ICD-10-CM | POA: Insufficient documentation

## 2017-02-04 DIAGNOSIS — X58XXXA Exposure to other specified factors, initial encounter: Secondary | ICD-10-CM | POA: Insufficient documentation

## 2017-02-04 DIAGNOSIS — S29012A Strain of muscle and tendon of back wall of thorax, initial encounter: Secondary | ICD-10-CM | POA: Insufficient documentation

## 2017-02-04 DIAGNOSIS — Y939 Activity, unspecified: Secondary | ICD-10-CM | POA: Insufficient documentation

## 2017-02-04 DIAGNOSIS — M62838 Other muscle spasm: Secondary | ICD-10-CM

## 2017-02-04 DIAGNOSIS — T148XXA Other injury of unspecified body region, initial encounter: Secondary | ICD-10-CM

## 2017-02-04 LAB — CBC
HEMATOCRIT: 40.1 % (ref 35.0–47.0)
HEMOGLOBIN: 13.8 g/dL (ref 12.0–16.0)
MCH: 32.2 pg (ref 26.0–34.0)
MCHC: 34.5 g/dL (ref 32.0–36.0)
MCV: 93.3 fL (ref 80.0–100.0)
PLATELETS: 405 10*3/uL (ref 150–440)
RBC: 4.3 MIL/uL (ref 3.80–5.20)
RDW: 13.5 % (ref 11.5–14.5)
WBC: 9 10*3/uL (ref 3.6–11.0)

## 2017-02-04 LAB — BASIC METABOLIC PANEL
ANION GAP: 9 (ref 5–15)
BUN: 11 mg/dL (ref 6–20)
CHLORIDE: 104 mmol/L (ref 101–111)
CO2: 26 mmol/L (ref 22–32)
CREATININE: 0.89 mg/dL (ref 0.44–1.00)
Calcium: 8.7 mg/dL — ABNORMAL LOW (ref 8.9–10.3)
GFR calc non Af Amer: >60 mL/min (ref >60)
Glucose, Bld: 102 mg/dL — ABNORMAL HIGH (ref 65–99)
POTASSIUM: 4.1 mmol/L (ref 3.5–5.1)
SODIUM: 139 mmol/L (ref 135–145)

## 2017-02-04 LAB — TROPONIN I: Troponin I: <0.03 ng/mL (ref <0.03)

## 2017-02-04 MED ORDER — DIAZEPAM 5 MG PO TABS
5.0000 mg | ORAL_TABLET | Freq: Once | ORAL | Status: AC
  Administered 2017-02-04: 5 mg via ORAL
  Filled 2017-02-04: qty 1

## 2017-02-04 MED ORDER — OXYCODONE-ACETAMINOPHEN 5-325 MG PO TABS: 1.0000 | ORAL_TABLET | Freq: Four times a day (QID) | ORAL | 0 refills | Status: DC | PRN

## 2017-02-04 MED ORDER — CYCLOBENZAPRINE HCL 10 MG PO TABS: 10.0000 mg | ORAL_TABLET | Freq: Three times a day (TID) | ORAL | 0 refills | Status: DC | PRN

## 2017-02-04 MED ORDER — OXYCODONE-ACETAMINOPHEN 5-325 MG PO TABS
2.0000 | ORAL_TABLET | Freq: Once | ORAL | Status: AC
  Administered 2017-02-04: 2 via ORAL
  Filled 2017-02-04: qty 2

## 2017-02-04 MED ORDER — NAPROXEN 500 MG PO TABS
500.0000 mg | ORAL_TABLET | Freq: Once | ORAL | Status: AC
  Administered 2017-02-04: 500 mg via ORAL
  Filled 2017-02-04: qty 1

## 2017-02-04 MED ORDER — CYCLOBENZAPRINE HCL 10 MG PO TABS
10.0000 mg | ORAL_TABLET | Freq: Once | ORAL | Status: AC
  Administered 2017-02-04: 10 mg via ORAL
  Filled 2017-02-04: qty 1

## 2017-02-04 NOTE — ED Triage Notes (Signed)
Patient from home VIA POV to [facility] ED with c/o centralized chest pain, right sided chest pain with radiation through to back, and radiation to face. Patient has had symptoms for 46 minutes. Tearful in triage.

## 2017-02-04 NOTE — ED Provider Notes (Signed)
[facility] Emergency Department Provider Note  ____________________________________________   First MD Initiated Contact with Patient 02/04/17 1323     (approximate)  I have reviewed the triage vital signs and the nursing notes.   HISTORY  Chief Complaint Chest Pain   HPI Teresa Newman is a 51 y.o. female who self presents to the emergency department with 1 month of left back pain radiating forward towards her left chest. Symptoms began acutely today roughly an hour prior to arrival. The pain is under her scapula on the left feels cramping aching and the pain is mostly constant. It is worse with twisting and moving. It is nonexertional. It is not crushing. It is not ripping or tearing. She has no shortness of breath. She has no leg swelling.   Past Medical History:  Diagnosis Date  . Anxiety disorder     There are no active problems to display for this patient.   Past Surgical History:  Procedure Laterality Date  . ABDOMINAL HYSTERECTOMY    . APPENDECTOMY    . TONSILLECTOMY      Prior to Admission medications   Medication Sig Start Date End Date Taking? Authorizing Provider  butalbital-acetaminophen-caffeine (FIORICET, ESGIC) [phone] MG tablet Take 1-2 tablets by mouth every 6 (six) hours as needed for headache. 09/03/16 09/03/17  Rebecka Apley, MD  cephALEXin (KEFLEX) 500 MG capsule Take 1 capsule (500 mg total) by mouth 3 (three) times daily. 06/02/15   Sharman Cheek, MD  cyclobenzaprine (FLEXERIL) 10 MG tablet Take 1 tablet (10 mg total) by mouth 3 (three) times daily as needed for muscle spasms. 02/04/17   Merrily Brittle, MD  naproxen (NAPROSYN) 500 MG tablet Take 1 tablet (500 mg total) by mouth 2 (two) times daily with a meal. 10/13/15   Joni Reining, PA-C  ondansetron (ZOFRAN ODT) 8 MG disintegrating tablet Take 1 tablet (8 mg total) by mouth every 8 (eight) hours as needed for nausea or vomiting. 06/02/15   Sharman Cheek, MD    oxyCODONE-acetaminophen (ROXICET) 5-325 MG tablet Take 1 tablet by mouth every 6 (six) hours as needed for severe pain. 02/04/17   Merrily Brittle, MD  PARoxetine (PAXIL) 40 MG tablet Take 40 mg by mouth daily.    [provider]    Allergies Patient has no known allergies.  No family history on file.  Social History Social History  Substance Use Topics  . Smoking status: Never Smoker  . Smokeless tobacco: Never Used  . Alcohol use No    Review of Systems Constitutional: No fever/chills Eyes: No visual changes. ENT: No sore throat. Cardiovascular: positive chest pain. Respiratory: Denies shortness of breath. Gastrointestinal: No abdominal pain.  No nausea, no vomiting.  No diarrhea.  No constipation. Genitourinary: Negative for dysuria. Musculoskeletal: positive for back pain. Skin: Negative for rash. Neurological: Negative for headaches, focal weakness or numbness.   ____________________________________________   PHYSICAL EXAM:  VITAL SIGNS: ED Triage Vitals  Enc Vitals Group     BP 02/04/17 1247 (!) 142/92     Pulse Rate 02/04/17 1247 87     Resp 02/04/17 1247 16     Temp 02/04/17 1247 98.1 F (36.7 C)     Temp Source 02/04/17 1247 Oral     SpO2 --      Weight 02/04/17 1246 245 lb (111.1 kg)     Height 02/04/17 1246  (1.803 m)     Head Circumference --      Peak Flow --  Pain Score --      Pain Loc --      Pain Edu? --      Excl. in [place]? --     Constitutional: alert and oriented 4 appears uncomfortable when sitting holding her right back no diaphoresis Eyes: PERRL EOMI. Head: Atraumatic. Nose: No congestion/rhinnorhea. Mouth/Throat: No trismus Neck: No stridor.   Cardiovascular: Normal rate, regular rhythm. Grossly normal heart sounds.  Good peripheral circulation. Respiratory: Normal respiratory effort.  No retractions. Lungs CTAB and moving good air Gastrointestinal: soft nontender Musculoskeletal: focal tenderness to her right  upper back with spasm just inferior and medial to the right scapula  Neurologic:  Normal speech and language. No gross focal neurologic deficits are appreciated. Skin:  Skin is warm, dry and intact. No rash noted. Psychiatric: Mood and affect are normal. Speech and behavior are normal.    ____________________________________________   DIFFERENTIAL includes but not limited to  muscular skeletal pain, acute coronary syndrome, pulmonary embolism, aortic dissection, pneumonia, pneumothorax ____________________________________________   LABS (all labs ordered are listed, but only abnormal results are displayed)  Labs Reviewed  BASIC METABOLIC PANEL - Abnormal; Notable for the following:       Result Value   Glucose, Bld 102 (*)    Calcium 8.7 (*)    All other components within normal limits  CBC  TROPONIN I    blood work reviewed interpreted by me with no signs of acute ischemia __________________________________________  EKG  ED ECG REPORT I, Merrily Brittle, the attending physician, personally viewed and interpreted this ECG.  Date: 02/04/2017 EKG Time:  Rate: 86 Rhythm: normal sinus rhythm QRS Axis: normal Intervals: normal ST/T Wave abnormalities: normal Narrative Interpretation: no evidence of acute ischemia ________________________________________  RADIOLOGY  chest x-ray reviewed by me with no acute disease ____________________________________________   PROCEDURES  Procedure(s) performed: no  Procedures  Critical Care performed: no  Observation: no ____________________________________________   INITIAL IMPRESSION / ASSESSMENT AND PLAN / ED COURSE  Pertinent labs & imaging results that were available during my care of the patient were reviewed by me and considered in my medical decision making (see chart for details).  The patient arrives quite uncomfortable appearing with 1 month of intermittent symptoms. Her pain is in her back going forward to her  chest and not the other way around. She has exquisite focal tenderness with muscle spasm. EKG is reassuring as is troponin and chest x-ray. The patient feels improved after muscle relaxants an opioid pain medication. I will give her a short course of outpatient but most importantly refer her back to primary care for further evaluation. She likely will require physical therapy. She is discharged home in improved condition and understands and agrees the plan.      ____________________________________________   FINAL CLINICAL IMPRESSION(S) / ED DIAGNOSES  Final diagnoses:  Muscle spasm  Muscle strain      NEW MEDICATIONS STARTED DURING THIS VISIT:  Discharge Medication List as of 02/04/2017  4:08 PM    START taking these medications   Details  cyclobenzaprine (FLEXERIL) 10 MG tablet Take 1 tablet (10 mg total) by mouth 3 (three) times daily as needed for muscle spasms., Starting Sun 02/04/2017, Print    oxyCODONE-acetaminophen (ROXICET) 5-325 MG tablet Take 1 tablet by mouth every 6 (six) hours as needed for severe pain., Starting Sun 02/04/2017, Print         Note:  This document was prepared using Dragon voice recognition software and may include unintentional dictation errors.  Merrily Brittle, MD 02/04/17 2155

## 2017-02-04 NOTE — Discharge Instructions (Signed)
IT IS NOT SAFE TO DRIVE AFTER YOU TAKE YOUR PAIN MEDICATION OR YOUR MUSCLE RELAXERS  Please make an appointment to follow-up with your primary care physician next week for reevaluation of her pain. Return to the emergency department sooner for any new or worsening symptoms.  It was a pleasure to take care of you today, and thank you for coming to our emergency department.  If you have any questions or concerns before leaving please ask the nurse to grab me and I'm more than happy to go through your aftercare instructions again.  If you were prescribed any opioid pain medication today such as Norco, Vicodin, Percocet, morphine, hydrocodone, or oxycodone please make sure you do not drive when you are taking this medication as it can alter your ability to drive safely.  If you have any concerns once you are home that you are not improving or are in fact getting worse before you can make it to your follow-up appointment, please do not hesitate to call 911 and come back for further evaluation.  Merrily Brittle, MD  Results for orders placed or performed during the hospital encounter of 02/04/17  Basic metabolic panel  Result Value Ref Range   Sodium 139 135 - 145 mmol/L   Potassium 4.1 3.5 - 5.1 mmol/L   Chloride 104 101 - 111 mmol/L   CO2 26 22 - 32 mmol/L   Glucose, Bld 102 (H) 65 - 99 mg/dL   BUN 11 6 - 20 mg/dL   Creatinine, Ser 7.25 0.44 - 1.00 mg/dL   Calcium 8.7 (L) 8.9 - 10.3 mg/dL   GFR calc non Af Amer >60 >60 mL/min   GFR calc Af Amer >60 >60 mL/min   Anion gap 9 5 - 15  CBC  Result Value Ref Range   WBC 9.0 3.6 - 11.0 K/uL   RBC 4.30 3.80 - 5.20 MIL/uL   Hemoglobin 13.8 12.0 - 16.0 g/dL   HCT 36.6 44.0 - 34.7 %   MCV 93.3 80.0 - 100.0 fL   MCH 32.2 26.0 - 34.0 pg   MCHC 34.5 32.0 - 36.0 g/dL   RDW 42.5 95.6 - 38.7 %   Platelets 405 150 - 440 K/uL  Troponin I  Result Value Ref Range   Troponin I <0.03 <0.03 ng/mL   Dg Chest 2 View  Result Date: 02/04/2017 CLINICAL  DATA:  Central chest pain and right back pain for the past month. This morning, the pain began radiating to her face. EXAM: CHEST  2 VIEW COMPARISON:  01/13/2013. FINDINGS: Normal sized heart. Clear lungs. Mild thoracic spine degenerative changes. IMPRESSION: No acute abnormality. Electronically Signed   By: Beckie Salts M.D.   On: 02/04/2017 14:41

## 2017-03-14 ENCOUNTER — Encounter: Payer: Self-pay | Admitting: Emergency Medicine

## 2017-03-14 DIAGNOSIS — Z79899 Other long term (current) drug therapy: Secondary | ICD-10-CM | POA: Insufficient documentation

## 2017-03-14 DIAGNOSIS — R300 Dysuria: Secondary | ICD-10-CM | POA: Insufficient documentation

## 2017-03-14 DIAGNOSIS — N390 Urinary tract infection, site not specified: Secondary | ICD-10-CM | POA: Insufficient documentation

## 2017-03-14 LAB — COMPREHENSIVE METABOLIC PANEL
ALK PHOS: 66 U/L (ref 38–126)
ALT: 33 U/L (ref 14–54)
AST: 32 U/L (ref 15–41)
Albumin: 3.7 g/dL (ref 3.5–5.0)
Anion gap: 10 (ref 5–15)
BILIRUBIN TOTAL: 0.8 mg/dL (ref 0.3–1.2)
BUN: 12 mg/dL (ref 6–20)
CALCIUM: 9.3 mg/dL (ref 8.9–10.3)
CO2: 27 mmol/L (ref 22–32)
CREATININE: 0.91 mg/dL (ref 0.44–1.00)
Chloride: 99 mmol/L — ABNORMAL LOW (ref 101–111)
Glucose, Bld: 135 mg/dL — ABNORMAL HIGH (ref 65–99)
Potassium: 4.2 mmol/L (ref 3.5–5.1)
Sodium: 136 mmol/L (ref 135–145)
Total Protein: 7.3 g/dL (ref 6.5–8.1)

## 2017-03-14 LAB — URINALYSIS, COMPLETE (UACMP) WITH MICROSCOPIC
BILIRUBIN URINE: NEGATIVE
Bacteria, UA: NONE SEEN
GLUCOSE, UA: NEGATIVE mg/dL
KETONES UR: NEGATIVE mg/dL
Nitrite: NEGATIVE
PH: 6 (ref 5.0–8.0)
Protein, ur: NEGATIVE mg/dL
SPECIFIC GRAVITY, URINE: 1.012 (ref 1.005–1.030)

## 2017-03-14 LAB — CBC
HCT: 39.4 % (ref 35.0–47.0)
Hemoglobin: 13.3 g/dL (ref 12.0–16.0)
MCH: 32.1 pg (ref 26.0–34.0)
MCHC: 33.9 g/dL (ref 32.0–36.0)
MCV: 94.7 fL (ref 80.0–100.0)
PLATELETS: 379 10*3/uL (ref 150–440)
RBC: 4.16 MIL/uL (ref 3.80–5.20)
RDW: 13.9 % (ref 11.5–14.5)
WBC: 11.9 10*3/uL — AB (ref 3.6–11.0)

## 2017-03-14 LAB — LIPASE, BLOOD: Lipase: 37 U/L (ref 11–51)

## 2017-03-14 MED ORDER — ONDANSETRON 4 MG PO TBDP
4.0000 mg | ORAL_TABLET | Freq: Once | ORAL | Status: AC | PRN
  Administered 2017-03-14: 4 mg via ORAL
  Filled 2017-03-14: qty 1

## 2017-03-14 NOTE — ED Triage Notes (Signed)
Pt comes into the ED via POV c/o lower abdominal pain, urinary frequency, nausea, and tenderness to the lower abdomen.  Patient in NAD at this time with even and unlabored respirations and ambulatory to triage at this time.  Patient believes that she may have a UTI.  Denies any chest pain, shortness of breath, dizziness, or vaginal discharge.

## 2017-03-15 ENCOUNTER — Emergency Department
Admission: EM | Admit: 2017-03-15 | Discharge: 2017-03-15 | Disposition: A | Discharge status: Home / Self Care | Payer: BLUE CROSS/BLUE SHIELD | Attending: Emergency Medicine | Admitting: Emergency Medicine

## 2017-03-15 DIAGNOSIS — N390 Urinary tract infection, site not specified: Secondary | ICD-10-CM

## 2017-03-15 DIAGNOSIS — R300 Dysuria: Secondary | ICD-10-CM

## 2017-03-15 MED ORDER — PHENAZOPYRIDINE HCL 200 MG PO TABS
200.0000 mg | ORAL_TABLET | Freq: Once | ORAL | Status: AC
  Administered 2017-03-15: 200 mg via ORAL
  Filled 2017-03-15: qty 1

## 2017-03-15 MED ORDER — PHENAZOPYRIDINE HCL 200 MG PO TABS: 200.0000 mg | ORAL_TABLET | Freq: Three times a day (TID) | ORAL | 0 refills | Status: DC | PRN

## 2017-03-15 MED ORDER — SULFAMETHOXAZOLE-TRIMETHOPRIM 800-160 MG PO TABS
1.0000 | ORAL_TABLET | Freq: Once | ORAL | Status: AC
  Administered 2017-03-15: 1 via ORAL
  Filled 2017-03-15: qty 1

## 2017-03-15 MED ORDER — SULFAMETHOXAZOLE-TRIMETHOPRIM 800-160 MG PO TABS: 1.0000 | ORAL_TABLET | Freq: Two times a day (BID) | ORAL | 0 refills | Status: DC

## 2017-03-15 NOTE — ED Notes (Signed)
Pt bladder scanned, no greater than 14ml detected.

## 2017-03-15 NOTE — Discharge Instructions (Signed)
1.  Take antibiotic as prescribed (Septra DS twice daily x 7 days). 2.  You may take medicine as needed for urinary symptoms (Pyridium #6). 3.  Return to the ER for worsening symptoms, persistent vomiting, fever, difficulty breathing or other concerns.

## 2017-03-15 NOTE — ED Provider Notes (Signed)
[facility] Emergency Department Provider Note   ____________________________________________   First MD Initiated Contact with Patient 03/15/17 0149     (approximate)  I have reviewed the triage vital signs and the nursing notes.   HISTORY  Chief Complaint Abdominal Pain    HPI Teresa Newman is a 51 y.o. female who presents to the ED from home with a chief complaint of suprapubic abdominal pain.  Patient reports a 1 day history of suprapubic abdominal pain, urinary urgency, frequency, dysuria, nausea.  She thinks she has a UTI.  Denies associated fever, chills, chest pain, shortness of breath, vaginal discharge, bleeding, vomiting or diarrhea.  Denies recent travel or trauma.   Past Medical History:  Diagnosis Date  . Anxiety disorder     There are no active problems to display for this patient.   Past Surgical History:  Procedure Laterality Date  . ABDOMINAL HYSTERECTOMY    . APPENDECTOMY    . TONSILLECTOMY      Prior to Admission medications   Medication Sig Start Date End Date Taking? Authorizing Provider  butalbital-acetaminophen-caffeine (FIORICET, ESGIC) [id number] MG tablet Take 1-2 tablets by mouth every 6 (six) hours as needed for headache. 09/03/16 09/03/17  Rebecka ApleyWebster, Allison P, MD  cephALEXin (KEFLEX) 500 MG capsule Take 1 capsule (500 mg total) by mouth 3 (three) times daily. 06/02/15   Sharman CheekStafford, Phillip, MD  cyclobenzaprine (FLEXERIL) 10 MG tablet Take 1 tablet (10 mg total) by mouth 3 (three) times daily as needed for muscle spasms. 02/04/17   Merrily Brittleifenbark, Neil, MD  naproxen (NAPROSYN) 500 MG tablet Take 1 tablet (500 mg total) by mouth 2 (two) times daily with a meal. 10/13/15   Joni ReiningSmith, Ronald K, PA-C  ondansetron (ZOFRAN ODT) 8 MG disintegrating tablet Take 1 tablet (8 mg total) by mouth every 8 (eight) hours as needed for nausea or vomiting. 06/02/15   Sharman CheekStafford, Phillip, MD  oxyCODONE-acetaminophen (ROXICET) 5-325 MG tablet Take 1 tablet  by mouth every 6 (six) hours as needed for severe pain. 02/04/17   Merrily Brittleifenbark, Neil, MD  PARoxetine (PAXIL) 40 MG tablet Take 40 mg by mouth daily.    [provider]  phenazopyridine (PYRIDIUM) 200 MG tablet Take 1 tablet (200 mg total) 3 (three) times daily as needed by mouth for pain. 03/15/17   Irean HongSung, Jade J, MD  sulfamethoxazole-trimethoprim (BACTRIM DS,SEPTRA DS) 800-160 MG tablet Take 1 tablet 2 (two) times daily by mouth. 03/15/17   Irean HongSung, Jade J, MD    Allergies Patient has no known allergies.  No family history on file.  Social History Social History   Tobacco Use  . Smoking status: Never Smoker  . Smokeless tobacco: Never Used  Substance Use Topics  . Alcohol use: No  . Drug use: No    Review of Systems   Constitutional: No fever/chills. Eyes: No visual changes. ENT: No sore throat. Cardiovascular: Denies chest pain. Respiratory: Denies shortness of breath. Gastrointestinal: Positive for suprapubic abdominal pain.  No nausea, no vomiting.  No diarrhea.  No constipation. Genitourinary: Positive for frequency, urgency and dysuria. Musculoskeletal: Negative for back pain. Skin: Negative for rash. Neurological: Negative for headaches, focal weakness or numbness.   ____________________________________________   PHYSICAL EXAM:  VITAL SIGNS: ED Triage Vitals  Enc Vitals Group     BP 03/14/17 2104 138/78     Pulse Rate 03/14/17 2104 76     Resp 03/14/17 2104 18     Temp 03/14/17 2104 97.9 F (36.6 C)  Temp Source 03/14/17 2104 Oral     SpO2 03/14/17 2104 96 %     Weight 03/14/17 2102 240 lb (108.9 kg)     Height 03/14/17 2102 5\' 9"  (1.753 m)     Head Circumference --      Peak Flow --      Pain Score 03/14/17 2102 7     Pain Loc --      Pain Edu? --      Excl. in GC? --     Constitutional: Alert and oriented. Well appearing and in no acute distress. Eyes: Conjunctivae are normal. PERRL. EOMI. Head: Atraumatic. Nose: No  congestion/rhinnorhea. Mouth/Throat: Mucous membranes are moist.  Oropharynx non-erythematous. Neck: No stridor.   Cardiovascular: Normal rate, regular rhythm. Grossly normal heart sounds.  Good peripheral circulation. Respiratory: Normal respiratory effort.  No retractions. Lungs CTAB. Gastrointestinal: Soft and mildly tender to palpation suprapubic area without rebound or guarding. No distention. No abdominal bruits. No CVA tenderness. Musculoskeletal: No lower extremity tenderness nor edema.  No joint effusions. Neurologic:  Normal speech and language. No gross focal neurologic deficits are appreciated. No gait instability. Skin:  Skin is warm, dry and intact. No rash noted. Psychiatric: Mood and affect are normal. Speech and behavior are normal.  ____________________________________________   LABS (all labs ordered are listed, but only abnormal results are displayed)  Labs Reviewed  COMPREHENSIVE METABOLIC PANEL - Abnormal; Notable for the following components:      Result Value   Chloride 99 (*)    Glucose, Bld 135 (*)    All other components within normal limits  CBC - Abnormal; Notable for the following components:   WBC 11.9 (*)    All other components within normal limits  URINALYSIS, COMPLETE (UACMP) WITH MICROSCOPIC - Abnormal; Notable for the following components:   Color, Urine YELLOW (*)    APPearance HAZY (*)    Hgb urine dipstick SMALL (*)    Leukocytes, UA SMALL (*)    Squamous Epithelial / LPF 6-30 (*)    All other components within normal limits  LIPASE, BLOOD   ____________________________________________  EKG  None ____________________________________________  RADIOLOGY  No results found.  ____________________________________________   PROCEDURES  Procedure(s) performed: None  Procedures  Critical Care performed: No  ____________________________________________   INITIAL IMPRESSION / ASSESSMENT AND PLAN / ED COURSE  As part of my  medical decision making, I reviewed the following data within the electronic MEDICAL RECORD NUMBER Nursing notes reviewed and incorporated, Labs reviewed and Notes from prior ED visits.   51 year old female who presents with symptoms consistent with UTI.  Postvoid residual reveals 14mLs.  Will discharge home on antibiotics, Pyridium and follow-up with her PCP.  Strict return precautions given.  Patient verbalized understanding and agrees with plan of care.      ____________________________________________   FINAL CLINICAL IMPRESSION(S) / ED DIAGNOSES  Final diagnoses:  Lower urinary tract infectious disease  Dysuria     ED Discharge Orders        Ordered    phenazopyridine (PYRIDIUM) 200 MG tablet  3 times daily PRN     03/15/17 0340    sulfamethoxazole-trimethoprim (BACTRIM DS,SEPTRA DS) 800-160 MG tablet  2 times daily     03/15/17 0340       Note:  This document was prepared using Dragon voice recognition software and may include unintentional dictation errors.    Irean HongSung, Jade J, MD 03/15/17 [phone]

## 2017-11-07 ENCOUNTER — Other Ambulatory Visit: Payer: Self-pay

## 2017-11-07 ENCOUNTER — Emergency Department: Payer: Self-pay

## 2017-11-07 ENCOUNTER — Emergency Department
Admission: EM | Admit: 2017-11-07 | Discharge: 2017-11-07 | Disposition: A | Discharge status: Home / Self Care | Payer: Self-pay | Attending: Student in an Organized Health Care Education/Training Program | Admitting: Student in an Organized Health Care Education/Training Program

## 2017-11-07 ENCOUNTER — Encounter: Payer: Self-pay | Admitting: Emergency Medicine

## 2017-11-07 DIAGNOSIS — N3001 Acute cystitis with hematuria: Secondary | ICD-10-CM | POA: Insufficient documentation

## 2017-11-07 DIAGNOSIS — R109 Unspecified abdominal pain: Secondary | ICD-10-CM

## 2017-11-07 LAB — CBC
HEMATOCRIT: 39 % (ref 35.0–47.0)
Hemoglobin: 13.4 g/dL (ref 12.0–16.0)
MCH: 32.7 pg (ref 26.0–34.0)
MCHC: 34.3 g/dL (ref 32.0–36.0)
MCV: 95.3 fL (ref 80.0–100.0)
PLATELETS: 360 10*3/uL (ref 150–440)
RBC: 4.09 MIL/uL (ref 3.80–5.20)
RDW: 13.9 % (ref 11.5–14.5)
WBC: 9.3 10*3/uL (ref 3.6–11.0)

## 2017-11-07 LAB — URINALYSIS, COMPLETE (UACMP) WITH MICROSCOPIC
Bilirubin Urine: NEGATIVE
Glucose, UA: NEGATIVE mg/dL
Ketones, ur: NEGATIVE mg/dL
Nitrite: NEGATIVE
Protein, ur: NEGATIVE mg/dL
SPECIFIC GRAVITY, URINE: 1.015 (ref 1.005–1.030)
WBC, UA: >50 WBC/hpf — ABNORMAL HIGH (ref 0–5)
pH: 6 (ref 5.0–8.0)

## 2017-11-07 LAB — BASIC METABOLIC PANEL
Anion gap: 7 (ref 5–15)
BUN: 13 mg/dL (ref 6–20)
CALCIUM: 8.6 mg/dL — AB (ref 8.9–10.3)
CO2: 26 mmol/L (ref 22–32)
Chloride: 106 mmol/L (ref 98–111)
Creatinine, Ser: 0.75 mg/dL (ref 0.44–1.00)
GFR calc Af Amer: >60 mL/min (ref >60)
Glucose, Bld: 102 mg/dL — ABNORMAL HIGH (ref 70–99)
Potassium: 4.1 mmol/L (ref 3.5–5.1)
SODIUM: 139 mmol/L (ref 135–145)

## 2017-11-07 MED ORDER — CEPHALEXIN 500 MG PO CAPS
500.0000 mg | ORAL_CAPSULE | Freq: Once | ORAL | Status: AC
  Administered 2017-11-07: 500 mg via ORAL

## 2017-11-07 MED ORDER — ONDANSETRON HCL 4 MG/2ML IJ SOLN
4.0000 mg | Freq: Once | INTRAMUSCULAR | Status: AC
  Administered 2017-11-07: 4 mg via INTRAVENOUS

## 2017-11-07 MED ORDER — ONDANSETRON HCL 4 MG/2ML IJ SOLN
INTRAMUSCULAR | Status: AC
  Filled 2017-11-07: qty 2

## 2017-11-07 MED ORDER — MORPHINE SULFATE (PF) 4 MG/ML IV SOLN
INTRAVENOUS | Status: AC
  Administered 2017-11-07: 4 mg via INTRAVENOUS
  Filled 2017-11-07: qty 1

## 2017-11-07 MED ORDER — PROMETHAZINE HCL 12.5 MG PO TABS: 12.5000 mg | ORAL_TABLET | Freq: Four times a day (QID) | ORAL | 0 refills | Status: DC | PRN

## 2017-11-07 MED ORDER — MORPHINE SULFATE (PF) 4 MG/ML IV SOLN
4.0000 mg | INTRAVENOUS | Status: DC | PRN
  Administered 2017-11-07: 4 mg via INTRAVENOUS

## 2017-11-07 MED ORDER — CEPHALEXIN 500 MG PO CAPS
ORAL_CAPSULE | ORAL | Status: AC
  Filled 2017-11-07: qty 1

## 2017-11-07 MED ORDER — CEPHALEXIN 500 MG PO CAPS: 500.0000 mg | ORAL_CAPSULE | Freq: Three times a day (TID) | ORAL | 0 refills | Status: AC

## 2017-11-07 MED ORDER — SODIUM CHLORIDE 0.9 % IV BOLUS
500.0000 mL | Freq: Once | INTRAVENOUS | Status: AC
  Administered 2017-11-07: 500 mL via INTRAVENOUS

## 2017-11-07 NOTE — ED Notes (Signed)
Called for pt for room no answer

## 2017-11-07 NOTE — ED Triage Notes (Signed)
Pt states right flank pain and RLQ pain that began yesterday, hx of kidney stones, states no blood in urine, does feel she has a uti as well.

## 2017-11-07 NOTE — Discharge Instructions (Addendum)

## 2017-11-07 NOTE — ED Notes (Signed)
Pt taken to CT.

## 2017-11-07 NOTE — ED Provider Notes (Signed)
[facility] Emergency Department Provider Note    First MD Initiated Contact with Patient 11/07/17 1551     (approximate)  I have reviewed the triage vital signs and the nursing notes.   HISTORY  Chief Complaint Flank Pain and Urinary Tract Infection    HPI Teresa Newman is a 52 y.o. female history of anxiety disorder as well as history of kidney stones status post appendectomy presents with right lower quadrant and right flank pain that started yesterday.  States that she does feel like she has been urinating more frequently with foul odor and is concerned for urinary tract infection as well.  States that she gets roughly 1 kidney stone attack a year and this does feel somewhat similar.  No fevers.  No nausea or vomiting.  States the pain is mild to moderate.    Past Medical History:  Diagnosis Date  . Anxiety disorder    No family history on file. Past Surgical History:  Procedure Laterality Date  . ABDOMINAL HYSTERECTOMY    . APPENDECTOMY    . TONSILLECTOMY     There are no active problems to display for this patient.     Prior to Admission medications   Medication Sig Start Date End Date Taking? Authorizing Provider  cephALEXin (KEFLEX) 500 MG capsule Take 1 capsule (500 mg total) by mouth 3 (three) times daily. 06/02/15   Sharman Cheek, MD  cyclobenzaprine (FLEXERIL) 10 MG tablet Take 1 tablet (10 mg total) by mouth 3 (three) times daily as needed for muscle spasms. 02/04/17   Merrily Brittle, MD  naproxen (NAPROSYN) 500 MG tablet Take 1 tablet (500 mg total) by mouth 2 (two) times daily with a meal. 10/13/15   Joni Reining, PA-C  ondansetron (ZOFRAN ODT) 8 MG disintegrating tablet Take 1 tablet (8 mg total) by mouth every 8 (eight) hours as needed for nausea or vomiting. 06/02/15   Sharman Cheek, MD  oxyCODONE-acetaminophen (ROXICET) 5-325 MG tablet Take 1 tablet by mouth every 6 (six) hours as needed for severe pain. 02/04/17    Merrily Brittle, MD  PARoxetine (PAXIL) 40 MG tablet Take 40 mg by mouth daily.    [provider]  phenazopyridine (PYRIDIUM) 200 MG tablet Take 1 tablet (200 mg total) 3 (three) times daily as needed by mouth for pain. 03/15/17   Irean Hong, MD  sulfamethoxazole-trimethoprim (BACTRIM DS,SEPTRA DS) 800-160 MG tablet Take 1 tablet 2 (two) times daily by mouth. 03/15/17   Irean Hong, MD    Allergies Patient has no known allergies.    Social History Social History   Tobacco Use  . Smoking status: Never Smoker  . Smokeless tobacco: Never Used  Substance Use Topics  . Alcohol use: No  . Drug use: No    Review of Systems Patient denies headaches, rhinorrhea, blurry vision, numbness, shortness of breath, chest pain, edema, cough, abdominal pain, nausea, vomiting, diarrhea, dysuria, fevers, rashes or hallucinations unless otherwise stated above in HPI. ____________________________________________   PHYSICAL EXAM:  VITAL SIGNS: Vitals:   11/07/17 1438  BP: 140/76  Pulse: 75  Resp: 16  Temp: 98 F (36.7 C)  SpO2: 99%    Constitutional: Alert and oriented.  Eyes: Conjunctivae are normal.  Head: Atraumatic. Nose: No congestion/rhinnorhea. Mouth/Throat: Mucous membranes are moist.   Neck: No stridor. Painless ROM.  Cardiovascular: Normal rate, regular rhythm. Grossly normal heart sounds.  Good peripheral circulation. Respiratory: Normal respiratory effort.  No retractions. Lungs CTAB. Gastrointestinal: Soft  and nontender. No distention. No abdominal bruits. No CVA tenderness. Genitourinary: deferred Musculoskeletal: No lower extremity tenderness nor edema.  No joint effusions. Neurologic:  Normal speech and language. No gross focal neurologic deficits are appreciated. No facial droop Skin:  Skin is warm, dry and intact. No rash noted. Psychiatric: Mood and affect are normal. Speech and behavior are normal.  ____________________________________________    LABS (all labs ordered are listed, but only abnormal results are displayed)  Results for orders placed or performed during the hospital encounter of 11/07/17 (from the past 24 hour(s))  CBC     Status: None   Collection Time: 11/07/17  2:43 PM  Result Value Ref Range   WBC 9.3 3.6 - 11.0 K/uL   RBC 4.09 3.80 - 5.20 MIL/uL   Hemoglobin 13.4 12.0 - 16.0 g/dL   HCT 16.1 09.6 - 04.5 %   MCV 95.3 80.0 - 100.0 fL   MCH 32.7 26.0 - 34.0 pg   MCHC 34.3 32.0 - 36.0 g/dL   RDW 40.9 81.1 - 91.4 %   Platelets 360 150 - 440 K/uL  Basic metabolic panel     Status: Abnormal   Collection Time: 11/07/17  2:43 PM  Result Value Ref Range   Sodium 139 135 - 145 mmol/L   Potassium 4.1 3.5 - 5.1 mmol/L   Chloride 106 98 - 111 mmol/L   CO2 26 22 - 32 mmol/L   Glucose, Bld 102 (H) 70 - 99 mg/dL   BUN 13 6 - 20 mg/dL   Creatinine, Ser 7.82 0.44 - 1.00 mg/dL   Calcium 8.6 (L) 8.9 - 10.3 mg/dL   GFR calc non Af Amer >60 >60 mL/min   GFR calc Af Amer >60 >60 mL/min   Anion gap 7 5 - 15   ____________________________________________   ____________________________________________  RADIOLOGY  I personally reviewed all radiographic images ordered to evaluate for the above acute complaints and reviewed radiology reports and findings.  These findings were personally discussed with the patient.  Please see medical record for radiology report.  ____________________________________________   PROCEDURES  Procedure(s) performed:  Procedures    Critical Care performed: no ____________________________________________   INITIAL IMPRESSION / ASSESSMENT AND PLAN / ED COURSE  Pertinent labs & imaging results that were available during my care of the patient were reviewed by me and considered in my medical decision making (see chart for details).   DDX: stone, pylo, colitis,l msk strain, AAA  Teresa Newman is a 52 y.o. who presents to the ED with symptoms as described above.  Blood work sent for  the above differential was reassuring shows no evidence of renal injury.  No evidence of sepsis based on presentation.  She status post appendectomy.  She is morbidly obese based on presentation with history of kidney stones and evidence of urinary tract infection on her urinalysis CT imaging ordered to exclude nephrolithiasis or obstructing stone.  CT imaging does not show evidence of hydronephrosis or acute stone at this point.  Symptoms most clinically consistent with a urinary tract infection probable early pyelonephritis given right flank pain.  Patient given IV fluids as well as IV pain medication and tolerated oral antibiotics.  At this point do feel patient stable and appropriate for outpatient follow-up.      As part of my medical decision making, I reviewed the following data within the electronic MEDICAL RECORD NUMBER Nursing notes reviewed and incorporated, Labs reviewed, notes from prior ED visits.   ____________________________________________  FINAL CLINICAL IMPRESSION(S) / ED DIAGNOSES  Final diagnoses:  Right flank pain  Acute cystitis with hematuria      NEW MEDICATIONS STARTED DURING THIS VISIT:  New Prescriptions   No medications on file     Note:  This document was prepared using Dragon voice recognition software and may include unintentional dictation errors.    Willy Eddyobinson, Sherlyn Ebbert, MD 11/07/17 [phone]

## 2017-12-20 ENCOUNTER — Other Ambulatory Visit: Payer: Self-pay

## 2017-12-20 ENCOUNTER — Emergency Department: Payer: Self-pay

## 2017-12-20 ENCOUNTER — Emergency Department
Admission: EM | Admit: 2017-12-20 | Discharge: 2017-12-20 | Disposition: A | Discharge status: Home / Self Care | Payer: Self-pay | Attending: Emergency Medicine | Admitting: Emergency Medicine

## 2017-12-20 DIAGNOSIS — L089 Local infection of the skin and subcutaneous tissue, unspecified: Secondary | ICD-10-CM

## 2017-12-20 DIAGNOSIS — N760 Acute vaginitis: Secondary | ICD-10-CM | POA: Insufficient documentation

## 2017-12-20 DIAGNOSIS — Z79899 Other long term (current) drug therapy: Secondary | ICD-10-CM | POA: Insufficient documentation

## 2017-12-20 DIAGNOSIS — B9689 Other specified bacterial agents as the cause of diseases classified elsewhere: Secondary | ICD-10-CM

## 2017-12-20 DIAGNOSIS — N3001 Acute cystitis with hematuria: Secondary | ICD-10-CM

## 2017-12-20 DIAGNOSIS — A599 Trichomoniasis, unspecified: Secondary | ICD-10-CM

## 2017-12-20 LAB — BASIC METABOLIC PANEL
ANION GAP: 5 (ref 5–15)
BUN: 9 mg/dL (ref 6–20)
CHLORIDE: 105 mmol/L (ref 98–111)
CO2: 29 mmol/L (ref 22–32)
Calcium: 8.4 mg/dL — ABNORMAL LOW (ref 8.9–10.3)
Creatinine, Ser: 0.83 mg/dL (ref 0.44–1.00)
GFR calc Af Amer: >60 mL/min (ref >60)
GFR calc non Af Amer: >60 mL/min (ref >60)
Glucose, Bld: 98 mg/dL (ref 70–99)
POTASSIUM: 4.6 mmol/L (ref 3.5–5.1)
SODIUM: 139 mmol/L (ref 135–145)

## 2017-12-20 LAB — URINALYSIS, COMPLETE (UACMP) WITH MICROSCOPIC
Bilirubin Urine: NEGATIVE
GLUCOSE, UA: NEGATIVE mg/dL
Ketones, ur: NEGATIVE mg/dL
Nitrite: NEGATIVE
Protein, ur: 100 mg/dL — AB
RBC / HPF: >50 RBC/hpf — ABNORMAL HIGH (ref 0–5)
SPECIFIC GRAVITY, URINE: 1.015 (ref 1.005–1.030)
pH: 8 (ref 5.0–8.0)

## 2017-12-20 LAB — CBC
HEMATOCRIT: 40 % (ref 35.0–47.0)
HEMOGLOBIN: 13.8 g/dL (ref 12.0–16.0)
MCH: 33 pg (ref 26.0–34.0)
MCHC: 34.5 g/dL (ref 32.0–36.0)
MCV: 95.7 fL (ref 80.0–100.0)
Platelets: 396 10*3/uL (ref 150–440)
RBC: 4.18 MIL/uL (ref 3.80–5.20)
RDW: 13.2 % (ref 11.5–14.5)
WBC: 8.2 10*3/uL (ref 3.6–11.0)

## 2017-12-20 LAB — WET PREP, GENITAL
SPERM: NONE SEEN
Yeast Wet Prep HPF POC: NONE SEEN

## 2017-12-20 LAB — CHLAMYDIA/NGC RT PCR (ARMC ONLY)
CHLAMYDIA TR: NOT DETECTED
N gonorrhoeae: NOT DETECTED

## 2017-12-20 MED ORDER — METRONIDAZOLE 500 MG PO TABS: 500.0000 mg | ORAL_TABLET | Freq: Two times a day (BID) | ORAL | 0 refills | Status: AC

## 2017-12-20 MED ORDER — CEFTRIAXONE SODIUM 1 G IJ SOLR
1.0000 g | Freq: Once | INTRAMUSCULAR | Status: AC
  Administered 2017-12-20: 1 g via INTRAMUSCULAR
  Filled 2017-12-20: qty 10

## 2017-12-20 MED ORDER — AZITHROMYCIN 500 MG PO TABS
1000.0000 mg | ORAL_TABLET | Freq: Once | ORAL | Status: AC
  Administered 2017-12-20: 1000 mg via ORAL
  Filled 2017-12-20: qty 2

## 2017-12-20 MED ORDER — LIDOCAINE HCL (PF) 1 % IJ SOLN
5.0000 mL | Freq: Once | INTRAMUSCULAR | Status: AC
  Administered 2017-12-20: 5 mL
  Filled 2017-12-20: qty 5

## 2017-12-20 MED ORDER — METRONIDAZOLE 500 MG PO TABS
2000.0000 mg | ORAL_TABLET | Freq: Once | ORAL | Status: AC
  Administered 2017-12-20: 2000 mg via ORAL
  Filled 2017-12-20: qty 4

## 2017-12-20 MED ORDER — METRONIDAZOLE 500 MG PO TABS: 500.0000 mg | ORAL_TABLET | Freq: Two times a day (BID) | ORAL | 0 refills | Status: DC

## 2017-12-20 MED ORDER — CEPHALEXIN 500 MG PO CAPS: 500.0000 mg | ORAL_CAPSULE | Freq: Four times a day (QID) | ORAL | 0 refills | Status: DC

## 2017-12-20 MED ORDER — CEPHALEXIN 500 MG PO CAPS: 500.0000 mg | ORAL_CAPSULE | Freq: Four times a day (QID) | ORAL | 0 refills | Status: AC

## 2017-12-20 NOTE — ED Triage Notes (Signed)
Pt c/o increased urinary frequency with vaginal irritation and vaginal discharge for the past week. States she has a hx of UTI in the past. States she has tried Engineer, materialsmonostat with no relief..Marland Kitchen

## 2017-12-20 NOTE — ED Provider Notes (Signed)
[facility] Emergency Department Provider Note  ____________________________________________  Time seen: Approximately 1:13 PM  I have reviewed the triage vital signs and the nursing notes.   HISTORY  Chief Complaint Urinary Frequency and Skin Ulcer    HPI Teresa Newman is a 52 y.o. female that presents emergency department for evaluation of back pain, generalized abdominal pain, dysuria, urinary frequency, vaginal discharge for several days.  Patient states that she also has an area to her bottom right abdomen that is irritated when it rubs on her pants.  It is mildly sore.  No drainage.  She had a UTI 1 month ago and finished antibiotics.  She has tried Monistat without relief.  Patient has a history of kidney stones. She is not sexually active. No fever, chills, nausea, vomiting.  Past Medical History:  Diagnosis Date  . Anxiety disorder     There are no active problems to display for this patient.   Past Surgical History:  Procedure Laterality Date  . ABDOMINAL HYSTERECTOMY    . APPENDECTOMY    . TONSILLECTOMY      Prior to Admission medications   Medication Sig Start Date End Date Taking? Authorizing Provider  cephALEXin (KEFLEX) 500 MG capsule Take 1 capsule (500 mg total) by mouth 4 (four) times daily for 10 days. 12/20/17 12/30/17  Enid Derry, PA-C  cyclobenzaprine (FLEXERIL) 10 MG tablet Take 1 tablet (10 mg total) by mouth 3 (three) times daily as needed for muscle spasms. 02/04/17   Merrily Brittle, MD  metroNIDAZOLE (FLAGYL) 500 MG tablet Take 1 tablet (500 mg total) by mouth 2 (two) times daily for 7 days. 12/20/17 12/27/17  Enid Derry, PA-C  naproxen (NAPROSYN) 500 MG tablet Take 1 tablet (500 mg total) by mouth 2 (two) times daily with a meal. 10/13/15   Joni Reining, PA-C  ondansetron (ZOFRAN ODT) 8 MG disintegrating tablet Take 1 tablet (8 mg total) by mouth every 8 (eight) hours as needed for nausea or vomiting. 06/02/15   Sharman Cheek, MD  oxyCODONE-acetaminophen (ROXICET) 5-325 MG tablet Take 1 tablet by mouth every 6 (six) hours as needed for severe pain. 02/04/17   Merrily Brittle, MD  PARoxetine (PAXIL) 40 MG tablet Take 40 mg by mouth daily.    [provider]  phenazopyridine (PYRIDIUM) 200 MG tablet Take 1 tablet (200 mg total) 3 (three) times daily as needed by mouth for pain. 03/15/17   Irean Hong, MD  promethazine (PHENERGAN) 12.5 MG tablet Take 1 tablet (12.5 mg total) by mouth every 6 (six) hours as needed for nausea or vomiting. 11/07/17   Willy Eddy, MD  sulfamethoxazole-trimethoprim (BACTRIM DS,SEPTRA DS) 800-160 MG tablet Take 1 tablet 2 (two) times daily by mouth. 03/15/17   Irean Hong, MD    Allergies Patient has no known allergies.  No family history on file.  Social History Social History   Tobacco Use  . Smoking status: Never Smoker  . Smokeless tobacco: Never Used  Substance Use Topics  . Alcohol use: No  . Drug use: No     Review of Systems  Constitutional: No fever/chills Gastrointestinal: No nausea, no vomiting.  Genitourinary: Positive for dysuria. Musculoskeletal: Negative for musculoskeletal pain. Skin: Negative for rash, abrasions, lacerations, ecchymosis. Neurological: Negative for headaches, numbness or tingling   ____________________________________________   PHYSICAL EXAM:  VITAL SIGNS: ED Triage Vitals  Enc Vitals Group     BP 12/20/17 1213 135/80     Pulse Rate 12/20/17 1213  85     Resp 12/20/17 1213 18     Temp 12/20/17 1213 98 F (36.7 C)     Temp Source 12/20/17 1213 Oral     SpO2 12/20/17 1213 95 %     Weight 12/20/17 1214 230 lb (104.3 kg)     Height 12/20/17 1214 5\' 9"  (1.753 m)     Head Circumference --      Peak Flow --      Pain Score 12/20/17 1213 9     Pain Loc --      Pain Edu? --      Excl. in GC? --      Constitutional: Alert and oriented. Well appearing and in no acute distress. Eyes: Conjunctivae are normal.  PERRL. EOMI. Head: Atraumatic. ENT:      Ears:      Nose: No congestion/rhinnorhea.      Mouth/Throat: Mucous membranes are moist.  Neck: No stridor.  Cardiovascular: Normal rate, regular rhythm.  Good peripheral circulation. Respiratory: Normal respiratory effort without tachypnea or retractions. Lungs CTAB. Good air entry to the bases with no decreased or absent breath sounds. Gastrointestinal: Bowel sounds 4 quadrants. Soft and nontender to palpation. No guarding or rigidity. No palpable masses. No distention. No CVA tenderness. Genitourinary: No external rashes or lesions.  Thick light green vaginal discharge.  No cervical motion tenderness. Musculoskeletal: Full range of motion to all extremities. No gross deformities appreciated. Neurologic:  Normal speech and language. No gross focal neurologic deficits are appreciated.  Skin:  Skin is warm, dry and intact.  1 cm area of mild erythema to low right abdomen with mild tenderness to palpation.  No swelling, fluctuance, induration. Psychiatric: Mood and affect are normal. Speech and behavior are normal. Patient exhibits appropriate insight and judgement.   ____________________________________________   LABS (all labs ordered are listed, but only abnormal results are displayed)  Labs Reviewed  WET PREP, GENITAL - Abnormal; Notable for the following components:      Result Value   Trich, Wet Prep PRESENT (*)    Clue Cells Wet Prep HPF POC PRESENT (*)    WBC, Wet Prep HPF POC MANY (*)    All other components within normal limits  URINALYSIS, COMPLETE (UACMP) WITH MICROSCOPIC - Abnormal; Notable for the following components:   Color, Urine YELLOW (*)    APPearance CLOUDY (*)    Hgb urine dipstick SMALL (*)    Protein, ur 100 (*)    Leukocytes, UA LARGE (*)    RBC / HPF >50 (*)    WBC, UA >50 (*)    Bacteria, UA RARE (*)    All other components within normal limits  BASIC METABOLIC PANEL - Abnormal; Notable for the following  components:   Calcium 8.4 (*)    All other components within normal limits  CHLAMYDIA/NGC RT PCR ([facility] ONLY)  CBC   ____________________________________________  EKG   ____________________________________________  RADIOLOGY   Ct Renal Stone Study  Result Date: 12/20/2017 CLINICAL DATA:  Urinary frequency with vaginal irritation. EXAM: CT ABDOMEN AND PELVIS WITHOUT CONTRAST TECHNIQUE: Multidetector CT imaging of the abdomen and pelvis was performed following the standard protocol without oral or IV contrast. COMPARISON:  November 07, 2017 FINDINGS: Lower chest: Lung bases are clear. Hepatobiliary: There is hepatic steatosis. No focal liver lesions are evident on this noncontrast enhanced study. Gallbladder wall is not appreciably thickened. There is no evident biliary duct dilatation. Pancreas: There is no pancreatic mass or inflammatory focus. Spleen:  No splenic lesions are evident. Adrenals/Urinary Tract: Adrenals bilaterally appear normal. There is a cyst in the medial midportion left kidney measuring approximately 1.2 x 1.2 cm. There is no evident hydronephrosis on either side. There is no appreciable renal or ureteral calculus on either side. Urinary bladder is midline with mild increase in wall thickness. Stomach/Bowel: There is no appreciable bowel wall or mesenteric thickening. There is moderate stool throughout the colon. There is no bowel obstruction. No free air or portal venous air. Vascular/Lymphatic: No abdominal aortic aneurysm. No evident vascular lesions on this noncontrast enhanced study. There is no appreciable adenopathy in the abdomen or pelvis. Reproductive: Uterus is absent. There is a cervical remnant with a cystic area along the distal aspect measuring 2.1 x 2.1 x 2.1 cm. Question nabothian cyst in this area. This finding was also present on previous study. No new pelvic mass evident. Other: There is a stable midline ventral hernia containing only fat. Appendix is  absent. There is no ascites or abscess in the abdomen or pelvis. Musculoskeletal: There is degenerative change in the lumbar spine. There are no blastic or lytic bone lesions. There is no intramuscular lesion evident. IMPRESSION: 1.  Stable midline ventral hernia containing fat but no bowel. 2. Uterus absent. Probable nabothian cyst arising from cervix. Appearance in this area is essentially stable compared to previous study. It is possible that a small amount of remaining lower uterine segment with cyst is possible. 3. Mild urinary bladder wall thickening. Suspect a degree of cystitis. No renal or ureteral calculus. No hydronephrosis. 4. No bowel obstruction. No abscess in the abdomen pelvis. Appendix absent. 5.  Hepatic steatosis. Electronically Signed   By: Bretta BangWilliam  Woodruff III M.D.   On: 12/20/2017 14:04    ____________________________________________    PROCEDURES  Procedure(s) performed:    Procedures    Medications  cefTRIAXone (ROCEPHIN) injection 1 g (1 g Intramuscular Given 12/20/17 1453)  azithromycin (ZITHROMAX) tablet 1,000 mg (1,000 mg Oral Given 12/20/17 1452)  metroNIDAZOLE (FLAGYL) tablet 2,000 mg (2,000 mg Oral Given 12/20/17 1452)  lidocaine (PF) (XYLOCAINE) 1 % injection 5 mL (5 mLs Other Given 12/20/17 1453)     ____________________________________________   INITIAL IMPRESSION / ASSESSMENT AND PLAN / ED COURSE  Pertinent labs & imaging results that were available during my care of the patient were reviewed by me and considered in my medical decision making (see chart for details).  Review of the [place] CSRS was performed in accordance of the [facility] prior to dispensing any controlled drugs.   Patient's diagnosis is consistent with bacterial vaginosis, trichomoniasis, cystitis, skin infection.  Urinalysis consistent with urinary tract infection.  No nephrolithiasis or hydronephrosis on CT.  CT results were discussed with patient.  Wet prep consistent with trichomoniasis  and bacterial vaginosis.  Patient was be treated empirically for gonorrhea and chlamydia before results.  A higher dose of ceftriaxone was used to cover her urinary tract infection.  Patient has a small area of erythema on her lower abdomen where her pants rub her skin.  She may be developing a small area of cellulitis.  No palpable abscess.  Patient was given IM ceftriaxone, oral metronidazole, oral azithromycin.  Patient will be discharged home with prescriptions for Keflex and Flagyl. Patient is to follow up with primary care as directed. Patient is given ED precautions to return to the ED for any worsening or new symptoms.     ____________________________________________  FINAL CLINICAL IMPRESSION(S) / ED DIAGNOSES  Final diagnoses:  Bacterial  vaginosis  Trichomoniasis  Skin infection  Acute cystitis with hematuria      NEW MEDICATIONS STARTED DURING THIS VISIT:  ED Discharge Orders         Ordered    cephALEXin (KEFLEX) 500 MG capsule  4 times daily,   Status:  Discontinued     12/20/17 1435    metroNIDAZOLE (FLAGYL) 500 MG tablet  2 times daily,   Status:  Discontinued     12/20/17 1435    cephALEXin (KEFLEX) 500 MG capsule  4 times daily     12/20/17 1455    metroNIDAZOLE (FLAGYL) 500 MG tablet  2 times daily     12/20/17 1455              This chart was dictated using voice recognition software/Dragon. Despite best efforts to proofread, errors can occur which can change the meaning. Any change was purely unintentional.    Enid Derry, PA-C 12/20/17 1528    Emily Filbert, MD 12/20/17 1550

## 2017-12-20 NOTE — ED Notes (Signed)
Pt states boil in her right groin area, states it is firm and painful and has been there a few days. Resting in bed, no distress noted, bed locked and low.

## 2018-01-29 DIAGNOSIS — R739 Hyperglycemia, unspecified: Secondary | ICD-10-CM | POA: Insufficient documentation

## 2018-01-29 DIAGNOSIS — G44209 Tension-type headache, unspecified, not intractable: Secondary | ICD-10-CM | POA: Insufficient documentation

## 2018-08-12 DIAGNOSIS — S46011A Strain of muscle(s) and tendon(s) of the rotator cuff of right shoulder, initial encounter: Secondary | ICD-10-CM | POA: Insufficient documentation

## 2018-11-29 DIAGNOSIS — Z91030 Bee allergy status: Secondary | ICD-10-CM | POA: Insufficient documentation

## 2019-07-15 ENCOUNTER — Other Ambulatory Visit: Payer: Self-pay | Admitting: Physician Assistant

## 2019-07-15 DIAGNOSIS — S46011A Strain of muscle(s) and tendon(s) of the rotator cuff of right shoulder, initial encounter: Secondary | ICD-10-CM

## 2019-07-25 ENCOUNTER — Other Ambulatory Visit: Payer: Self-pay

## 2019-07-25 ENCOUNTER — Ambulatory Visit
Admission: RE | Admit: 2019-07-25 | Discharge: 2019-07-25 | Disposition: A | Discharge status: Home / Self Care | Payer: 59 | Source: Ambulatory Visit | Attending: Physician Assistant | Admitting: Physician Assistant

## 2019-07-25 DIAGNOSIS — S46011A Strain of muscle(s) and tendon(s) of the rotator cuff of right shoulder, initial encounter: Secondary | ICD-10-CM | POA: Diagnosis present

## 2019-08-28 ENCOUNTER — Other Ambulatory Visit: Payer: 59

## 2019-08-31 ENCOUNTER — Other Ambulatory Visit: Payer: Self-pay | Admitting: Orthopedic Surgery

## 2019-09-02 ENCOUNTER — Other Ambulatory Visit: Payer: 59

## 2019-09-04 ENCOUNTER — Ambulatory Visit: Admit: 2019-09-04 | Payer: 59 | Admitting: Orthopedic Surgery

## 2019-09-04 SURGERY — SHOULDER ARTHROSCOPY WITH OPEN ROTATOR CUFF REPAIR AND DISTAL CLAVICLE ACROMINECTOMY
Anesthesia: General | Laterality: Right

## 2019-09-29 DIAGNOSIS — S46011A Strain of muscle(s) and tendon(s) of the rotator cuff of right shoulder, initial encounter: Secondary | ICD-10-CM | POA: Insufficient documentation

## 2020-01-09 ENCOUNTER — Other Ambulatory Visit: Payer: Self-pay | Admitting: Orthopedic Surgery

## 2020-01-09 DIAGNOSIS — S46019D Strain of muscle(s) and tendon(s) of the rotator cuff of unspecified shoulder, subsequent encounter: Secondary | ICD-10-CM

## 2020-01-26 ENCOUNTER — Ambulatory Visit
Admission: RE | Admit: 2020-01-26 | Discharge: 2020-01-26 | Disposition: A | Discharge status: Home / Self Care | Payer: 59 | Source: Ambulatory Visit | Attending: Orthopedic Surgery | Admitting: Orthopedic Surgery

## 2020-01-26 ENCOUNTER — Other Ambulatory Visit: Payer: Self-pay

## 2020-01-26 DIAGNOSIS — S46019D Strain of muscle(s) and tendon(s) of the rotator cuff of unspecified shoulder, subsequent encounter: Secondary | ICD-10-CM

## 2020-02-11 ENCOUNTER — Ambulatory Visit: Payer: 59

## 2021-07-20 IMAGING — MR MR SHOULDER*R* W/O CM
5 series · 36 of 40 positions shown · non-contrast
Comparison: None.

CLINICAL DATA: Right shoulder pain with limited range of motion
after fall 3 months ago

EXAM:
MRI OF THE RIGHT SHOULDER WITHOUT CONTRAST
TECHNIQUE: Multiplanar, multisequence MR imaging of the shoulder was performed.
No intravenous contrast was administered.

[Series 3: PD fat-sat · axial · right · 4.0mm · 0.55mm/px · z∈[-87,+43]mm · 8 of 28 slices shown (1 of 2)]
[im 1/28]
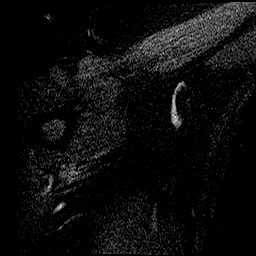
[im 4/28]
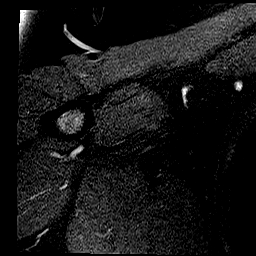
[im 8/28]
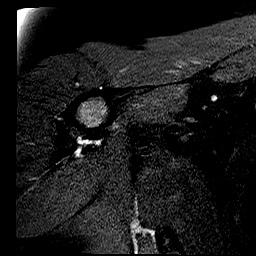
[im 12/28]
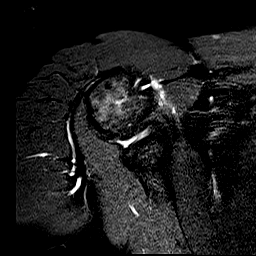
[im 16/28]
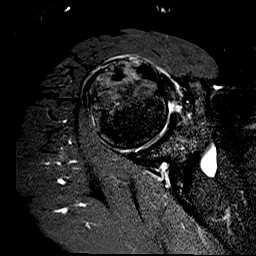
[im 20/28]
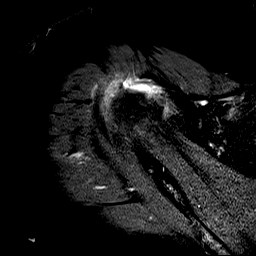
[im 24/28]
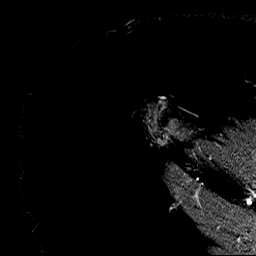
[im 28/28]
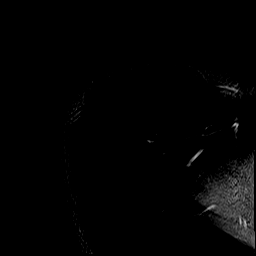

[Series 4: PD fat-sat · oblique · right · 4.0mm · 0.44mm/px · 8 of 26 slices shown (2 of 2)]
[im 1/26]
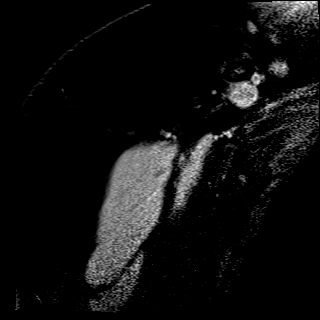
[im 4/26]
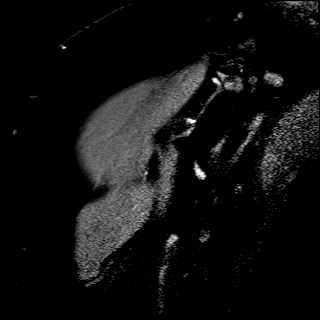
[im 8/26]
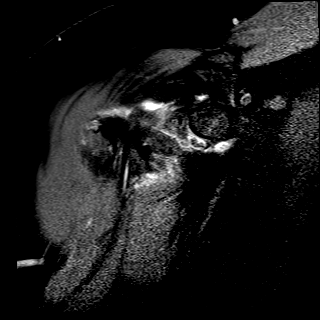
[im 11/26]
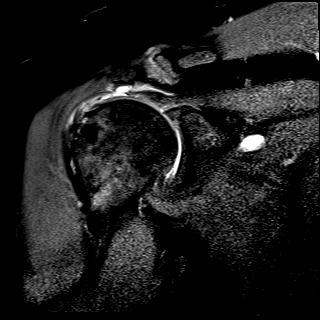
[im 15/26]
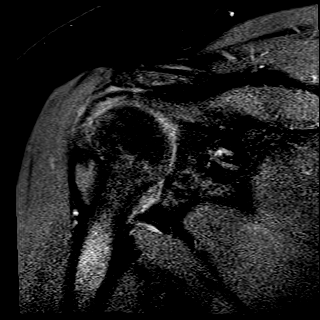
[im 18/26]
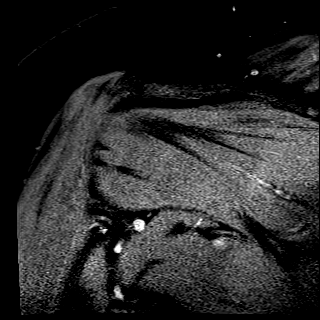
[im 22/26]
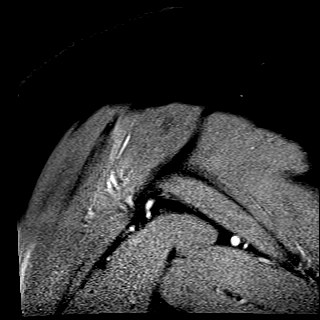
[im 26/26]
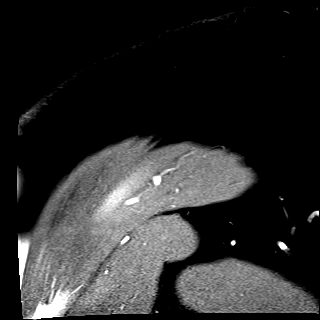

[Series 5: T2 fat-sat · oblique · right · 4.0mm · 0.44mm/px · 8 of 26 slices shown (1 of 2)]
[im 1/26]
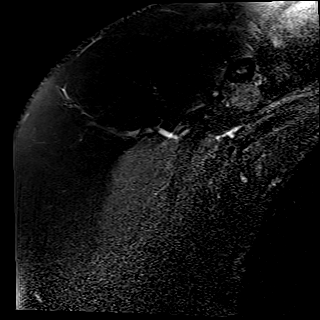
[im 4/26]
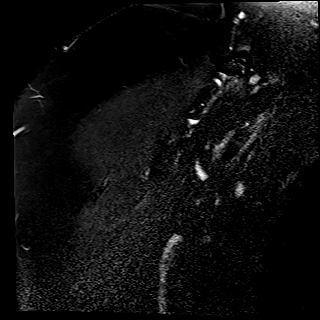
[im 8/26]
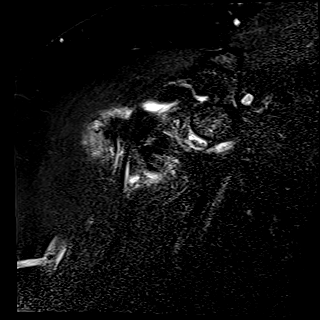
[im 11/26]
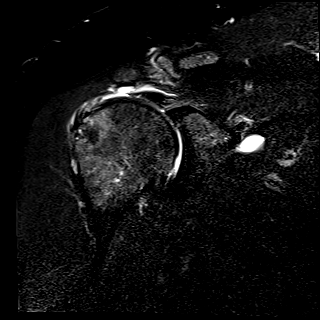
[im 15/26]
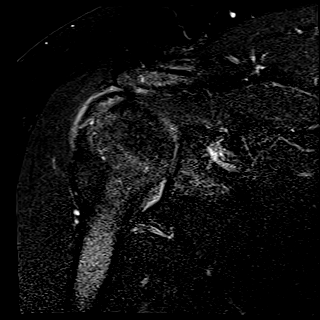
[im 18/26]
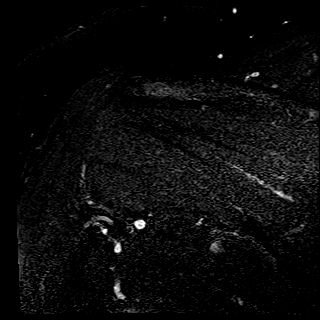
[im 22/26]
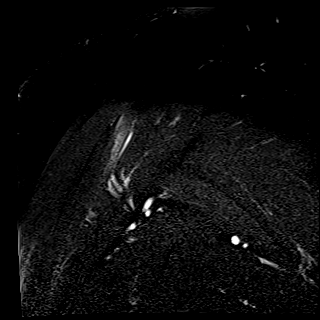
[im 26/26]
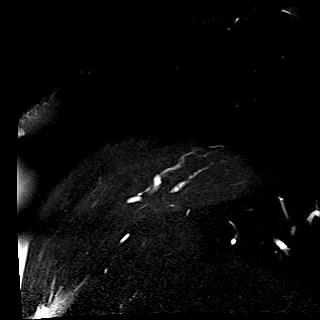

[Series 6: T2 fat-sat · oblique · right · 4.0mm · 0.27mm/px · 8 of 25 slices shown (2 of 2)]
[im 1/25]
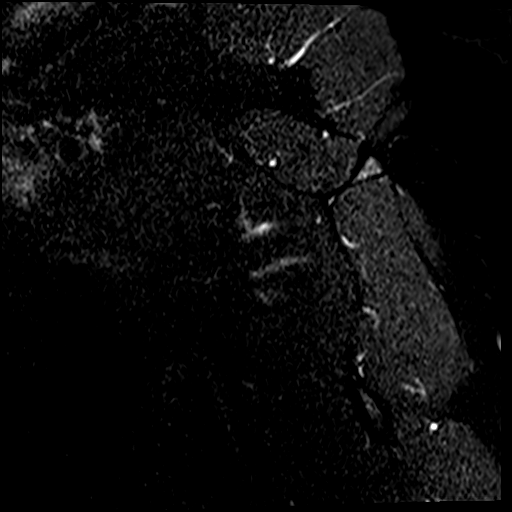
[im 4/25]
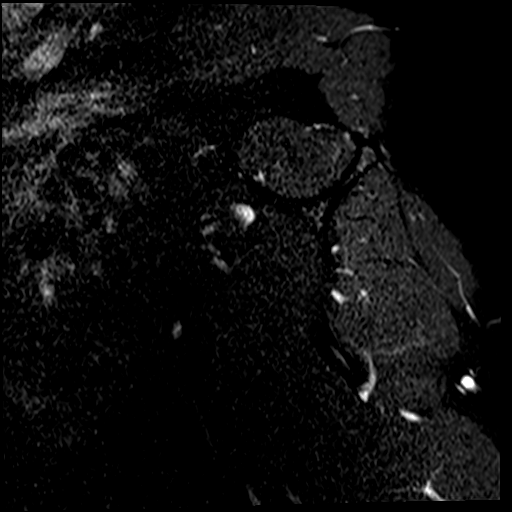
[im 7/25]
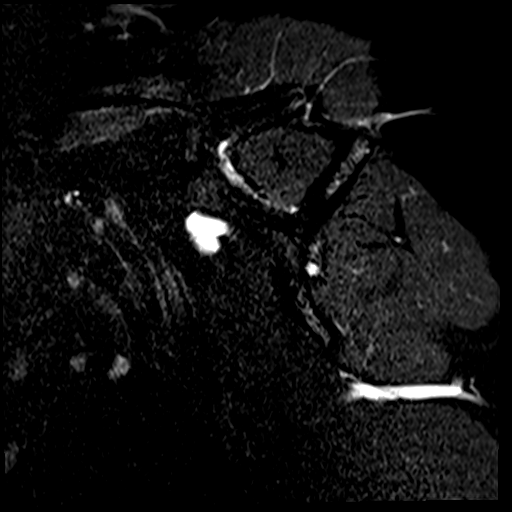
[im 11/25]
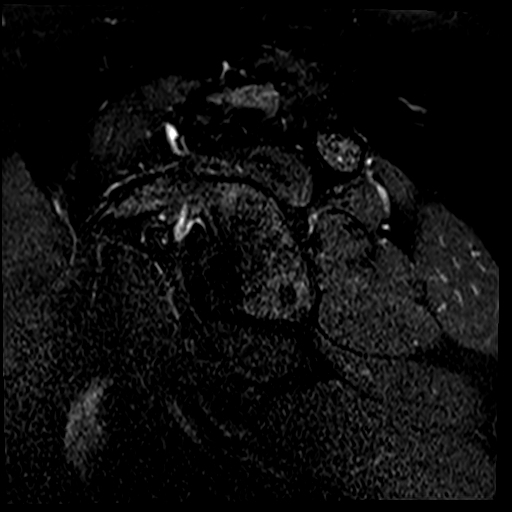
[im 14/25]
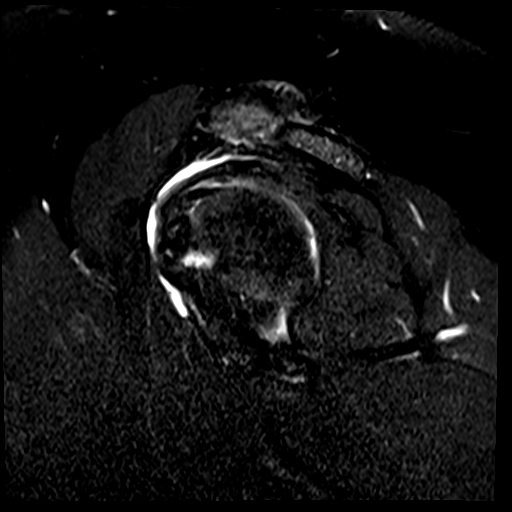
[im 18/25]
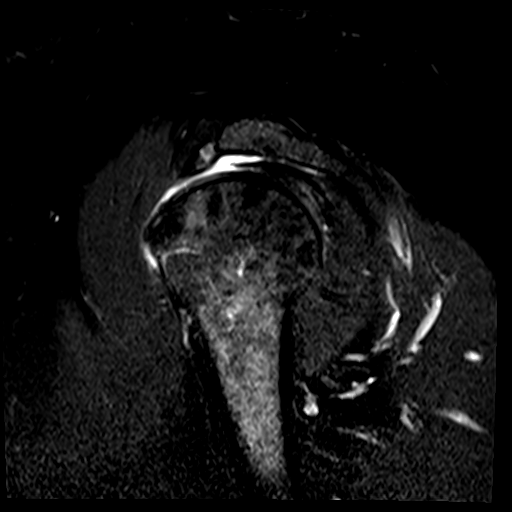
[im 21/25]
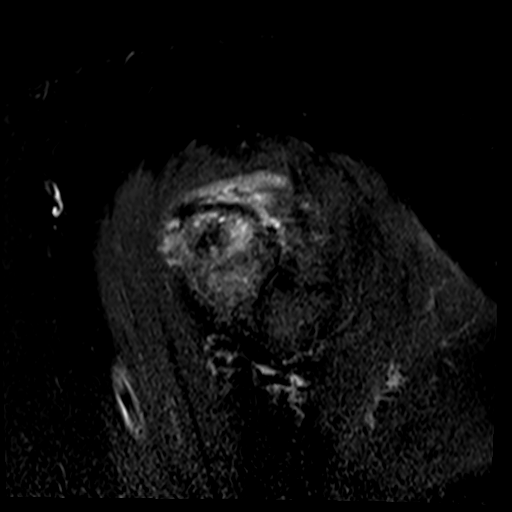
[im 25/25]
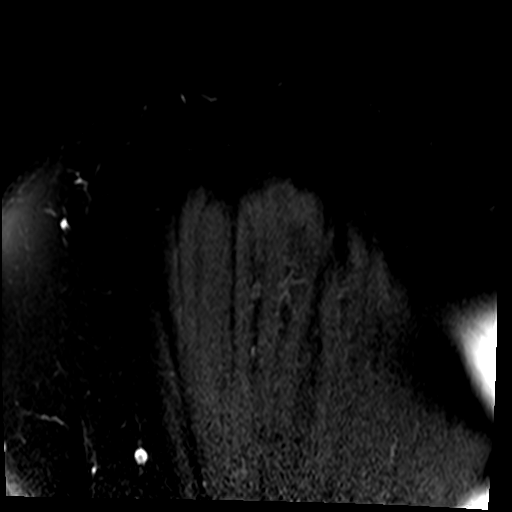

[Series 7: T1 · oblique · right · 4.0mm · 0.44mm/px · 4 of 25 slices shown]
[im 1/25]
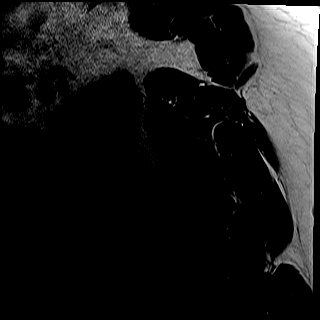
[im 4/25]
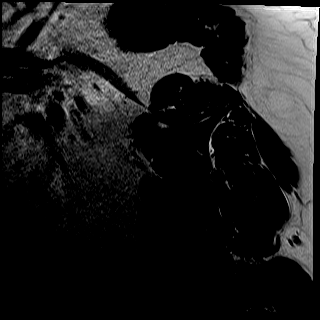
[im 7/25]
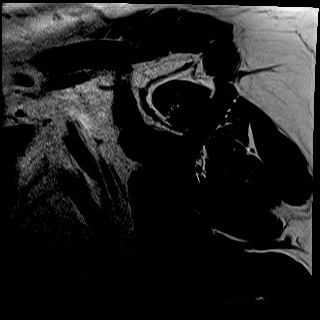
[im 11/25]
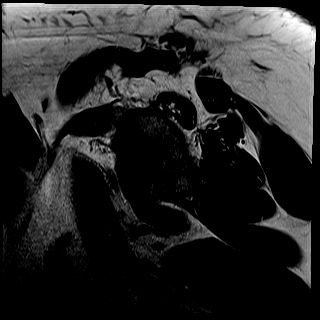

[36 of 40 positions shown; findings below may reference images not displayed]

FINDINGS: Rotator cuff: Full-thickness near full width tear of the
supraspinatus tendon in the region of the critical zone with up to
11 mm of tendinous retraction (series 5, images 15-17).
Infraspinatus and subscapularis intact with mild tendinosis. Intact
teres minor.

Muscles: No atrophy or abnormal signal of the muscles of the rotator
cuff.

Biceps long head:  Intra-articular biceps tendinosis.

Acromioclavicular Joint: Moderate arthropathy of the
acromioclavicular joint. Small volume subacromial-subdeltoid bursal
fluid.

Glenohumeral Joint: No significant joint effusion. Moderate diffuse
chondral thinning and surface irregularity of the humeral head and
glenoid articular surfaces.

Labrum: Appears degenerated. No well-defined tear on these
non-arthrographic images. No paralabral cyst.

Bones:  No marrow abnormality, fracture or dislocation.

Other: None.
IMPRESSION: 1. Full-thickness near full-width tear of the supraspinatus tendon
in the region of the critical zone with up to 11 mm of tendinous
retraction.
2. Mild subscapularis and subscapularis tendinosis.
3. Intra-articular biceps tendinosis.
4. Moderate acromioclavicular and glenohumeral osteoarthritis.

## 2022-06-01 DIAGNOSIS — L821 Other seborrheic keratosis: Secondary | ICD-10-CM | POA: Insufficient documentation

## 2022-06-22 ENCOUNTER — Other Ambulatory Visit: Payer: Self-pay

## 2022-06-22 DIAGNOSIS — Z1231 Encounter for screening mammogram for malignant neoplasm of breast: Secondary | ICD-10-CM

## 2022-07-24 ENCOUNTER — Ambulatory Visit: Payer: 59 | Attending: Obstetrics and Gynecology

## 2022-07-24 ENCOUNTER — Ambulatory Visit: Payer: 59

## 2023-03-31 DIAGNOSIS — Z1329 Encounter for screening for other suspected endocrine disorder: Secondary | ICD-10-CM | POA: Insufficient documentation

## 2023-03-31 DIAGNOSIS — J302 Other seasonal allergic rhinitis: Secondary | ICD-10-CM | POA: Insufficient documentation

## 2023-03-31 DIAGNOSIS — G629 Polyneuropathy, unspecified: Secondary | ICD-10-CM | POA: Insufficient documentation

## 2023-05-05 DIAGNOSIS — M5416 Radiculopathy, lumbar region: Secondary | ICD-10-CM | POA: Insufficient documentation

## 2023-05-10 DIAGNOSIS — E119 Type 2 diabetes mellitus without complications: Secondary | ICD-10-CM | POA: Insufficient documentation

## 2023-05-11 DIAGNOSIS — F40240 Claustrophobia: Secondary | ICD-10-CM | POA: Insufficient documentation

## 2023-10-26 ENCOUNTER — Emergency Department
Admission: EM | Admit: 2023-10-26 | Discharge: 2023-10-26 | Disposition: A | Discharge status: Home / Self Care | Attending: Emergency Medicine | Admitting: Emergency Medicine

## 2023-10-26 ENCOUNTER — Encounter: Payer: Self-pay | Admitting: Intensive Care

## 2023-10-26 ENCOUNTER — Emergency Department

## 2023-10-26 ENCOUNTER — Other Ambulatory Visit: Payer: Self-pay

## 2023-10-26 DIAGNOSIS — E11649 Type 2 diabetes mellitus with hypoglycemia without coma: Secondary | ICD-10-CM | POA: Diagnosis not present

## 2023-10-26 DIAGNOSIS — Z7984 Long term (current) use of oral hypoglycemic drugs: Secondary | ICD-10-CM | POA: Insufficient documentation

## 2023-10-26 DIAGNOSIS — F410 Panic disorder [episodic paroxysmal anxiety] without agoraphobia: Secondary | ICD-10-CM

## 2023-10-26 DIAGNOSIS — R42 Dizziness and giddiness: Secondary | ICD-10-CM | POA: Diagnosis present

## 2023-10-26 DIAGNOSIS — E162 Hypoglycemia, unspecified: Secondary | ICD-10-CM

## 2023-10-26 DIAGNOSIS — F419 Anxiety disorder, unspecified: Secondary | ICD-10-CM | POA: Diagnosis not present

## 2023-10-26 HISTORY — DX: Type 2 diabetes mellitus without complications: E11.9

## 2023-10-26 LAB — CBC
HCT: 48.1 % — ABNORMAL HIGH (ref 36.0–46.0)
Hemoglobin: 16 g/dL — ABNORMAL HIGH (ref 12.0–15.0)
MCH: 31.6 pg (ref 26.0–34.0)
MCHC: 33.3 g/dL (ref 30.0–36.0)
MCV: 95.1 fL (ref 80.0–100.0)
Platelets: 430 10*3/uL — ABNORMAL HIGH (ref 150–400)
RBC: 5.06 MIL/uL (ref 3.87–5.11)
RDW: 13.4 % (ref 11.5–15.5)
WBC: 11.7 10*3/uL — ABNORMAL HIGH (ref 4.0–10.5)
nRBC: 0 % (ref 0.0–0.2)

## 2023-10-26 LAB — BASIC METABOLIC PANEL WITH GFR
Anion gap: 9 (ref 5–15)
BUN: 19 mg/dL (ref 6–20)
CO2: 25 mmol/L (ref 22–32)
Calcium: 9 mg/dL (ref 8.9–10.3)
Chloride: 104 mmol/L (ref 98–111)
Creatinine, Ser: 0.82 mg/dL (ref 0.44–1.00)
GFR, Estimated: >60 mL/min (ref >60)
Glucose, Bld: 87 mg/dL (ref 70–99)
Potassium: 3.9 mmol/L (ref 3.5–5.1)
Sodium: 138 mmol/L (ref 135–145)

## 2023-10-26 LAB — TROPONIN I (HIGH SENSITIVITY): Troponin I (High Sensitivity): 4 ng/L (ref <18)

## 2023-10-26 NOTE — ED Triage Notes (Signed)
 Patient reports Chills,sweats, nausea, and irregular heart rate that started last night.  Started ozempic on Wednesday and had nausea after shot

## 2023-10-26 NOTE — Discharge Instructions (Signed)
 Your exam, labs, EKG, and chest x-ray are all normal and reassuring at this time.  Your symptoms did not appear to be due to a cardiac source.  No signs of infection are noted.  You may have experienced a low blood sugar episode at the time of your symptoms.  You also may have experienced an anxiety attack.  Take your prescription as directed.  Continue to monitor your blood sugar as discussed.  Eat regular balanced meals as discussed.

## 2023-10-26 NOTE — ED Provider Notes (Signed)
 [facility] Emergency Department Provider Note     Event Date/Time   First MD Initiated Contact with Patient 10/26/23 1715     (approximate)   History   Tachycardia   HPI  Teresa Newman is a 58 y.o. female with a history of type 2 diabetes and anxiety, presents to the ED after an episode of chills, sweats, nausea and what she describes as irregular fast heart beat.  Symptoms occurred in the overnight hours.  Patient apparently had gotten up to go to the bathroom, when she got back to the bed, she felt slightly dizzy, and was aware of her fast heart beat.  She experienced a nausea without vomiting as well as some cold sweats and chills.  She did not check her blood pressure or her blood sugar at that moment.  She had her initial Ozempic dose on Wednesday and experienced a fleeting episode of nausea after the injection.  Patient takes Jardiance daily for her blood sugar.  She also would admit to poor p.o. intake today while at work.  She denies any current symptoms presents to the ED for evaluation.  No chest pain, shortness of breath, bowel changes, diarrhea, or syncope reported.  Physical Exam   Triage Vital Signs: ED Triage Vitals [10/26/23 1513]  Encounter Vitals Group     BP (!) 157/75     Girls Systolic BP Percentile      Girls Diastolic BP Percentile      Boys Systolic BP Percentile      Boys Diastolic BP Percentile      Pulse Rate 91     Resp 16     Temp 98.2 F (36.8 C)     Temp Source Oral     SpO2 96 %     Weight 280 lb (127 kg)     Height 5' 10 (1.778 m)     Head Circumference      Peak Flow      Pain Score 0     Pain Loc      Pain Education      Exclude from Growth Chart     Most recent vital signs: Vitals:   10/26/23 1710 10/26/23 1800  BP: 129/66 135/67  Pulse: 77 78  Resp: 14 16  Temp: 97.9 F (36.6 C) 97.8 F (36.6 C)  SpO2: 97% 97%    General Awake, no distress. NAD HEENT NCAT. PERRL. EOMI. No rhinorrhea. Mucous  membranes are moist.  CV:  Good peripheral perfusion. RRR RESP:  Normal effort. CTA ABD:  No distention.  Soft and nontender   ED Results / Procedures / Treatments   Labs (all labs ordered are listed, but only abnormal results are displayed) Labs Reviewed  CBC - Abnormal; Notable for the following components:      Result Value   WBC 11.7 (*)    Hemoglobin 16.0 (*)    HCT 48.1 (*)    Platelets 430 (*)    All other components within normal limits  BASIC METABOLIC PANEL WITH GFR  TROPONIN I (HIGH SENSITIVITY)     EKG  Vent. rate 86 BPM PR interval 170 ms QRS duration 82 ms QT/QTcB 350/418 ms P-R-T axes 55 53 39 Normal sinus rhythm Normal ECG No STEMI  RADIOLOGY  I personally viewed and evaluated these images as part of my medical decision making, as well as reviewing the written report by the radiologist.  ED Provider Interpretation: No acute findings  DG  Chest 2 View Result Date: 10/26/2023 CLINICAL DATA:  Tachycardia EXAM: CHEST - 2 VIEW COMPARISON:  02/04/2017 FINDINGS: The heart size and mediastinal contours are within normal limits. Both lungs are clear. The visualized skeletal structures are unremarkable. IMPRESSION: No active cardiopulmonary disease. Electronically Signed   By: Esmeralda Hedge M.D.   On: 10/26/2023 18:08    PROCEDURES:  Critical Care performed: No  Procedures   MEDICATIONS ORDERED IN ED: Medications - No data to display   IMPRESSION / MDM / ASSESSMENT AND PLAN / ED COURSE  I reviewed the triage vital signs and the nursing notes.                              Differential diagnosis includes, but is not limited to, ACS, aortic dissection, pulmonary embolism, cardiac tamponade, pneumothorax, pneumonia, pericarditis, myocarditis, GI-related causes including esophagitis/gastritis, and musculoskeletal chest wall pain.    Patient's presentation is most consistent with acute presentation with potential threat to life or bodily  function.  Patient's diagnosis is consistent with an episode of tachycardia which may have been precipitated by a low blood sugar reading.  Patient presents with resolved symptoms with onset last night.  She did endorse poor intake today until reported to the ED.  Vital signs at this point are stable; without tachycardia. Labs overall reassuring without evidence of hypoglycemia.  No evidence of malignant arrhythmia on EKG, and chest x-ray interpreted by me, shows no acute intrathoracic process.  Troponin is also negative x 1 with low concern for ACS given patient's symptom onset.  No evidence of any electrolyte abnormality to raise concern for dehydration.  Patient will be discharged home with instructions to take her home meds.  She is also advised to eat regularly scheduled balanced meals.  She should check her blood sugar if symptoms recur or persist.  Patient is to follow up with her PCP as discussed, as needed or otherwise directed. Patient is given ED precautions to return to the ED for any worsening or new symptoms.  Patient is also being given amatory referral for her routine screening colonoscopy.   FINAL CLINICAL IMPRESSION(S) / ED DIAGNOSES   Final diagnoses:  Hypoglycemia  Anxiety attack     Rx / DC Orders   ED Discharge Orders     None        Note:  This document was prepared using Dragon voice recognition software and may include unintentional dictation errors.    May Sparks, PA-C 10/26/23 1834    Ruth Cove, MD 10/26/23 [phone]

## 2023-11-22 ENCOUNTER — Telehealth: Payer: Self-pay

## 2023-11-22 DIAGNOSIS — Z1211 Encounter for screening for malignant neoplasm of colon: Secondary | ICD-10-CM

## 2023-11-22 NOTE — Telephone Encounter (Signed)
 Gastroenterology Pre-Procedure Review  Request Date: TBD Requesting Physician: Dr. NELLIE  PATIENT REVIEW QUESTIONS: The patient responded to the following health history questions as indicated:    (Schedule on Wed/Thu bc she takes Ozempic on Wed.)  1. Are you having any GI issues? no 2. Do you have a personal history of Polyps? no 3. Do you have a family history of Colon Cancer or Polyps? yes (sister colon cancer) 4. Diabetes Mellitus? yes (takes Jardiance and Ozempic has been advised and noted on instructions to stop Jardiance 3 days prior and stop ozempic 7 days prior ) 5. Joint replacements in the past 12 months?no 6. Major health problems in the past 3 months?no 7. Any artificial heart valves, MVP, or defibrillator?no    MEDICATIONS & ALLERGIES:    Patient reports the following regarding taking any anticoagulation/antiplatelet therapy:   Plavix, Coumadin, Eliquis, Xarelto, Lovenox, Pradaxa, Brilinta, or Effient? no Aspirin? no  Patient confirms/reports the following medications:  Current Outpatient Medications  Medication Sig Dispense Refill   empagliflozin (JARDIANCE) 10 MG TABS tablet Take 10 mg by mouth daily.     venlafaxine XR (EFFEXOR-XR) 150 MG 24 hr capsule Take 150 mg by mouth daily with breakfast.     No current facility-administered medications for this visit.    Patient confirms/reports the following allergies:  Allergies  Allergen Reactions   Bee Venom Anaphylaxis    SOB    No orders of the defined types were placed in this encounter.   AUTHORIZATION INFORMATION Primary Insurance: 1D#: Group #:  Secondary Insurance: 1D#: Group #:  SCHEDULE INFORMATION: Date:  Time: Location:

## 2023-11-29 ENCOUNTER — Other Ambulatory Visit: Payer: Self-pay

## 2023-11-29 ENCOUNTER — Telehealth: Payer: Self-pay

## 2023-11-29 DIAGNOSIS — Z800 Family history of malignant neoplasm of digestive organs: Secondary | ICD-10-CM

## 2023-11-29 DIAGNOSIS — Z1211 Encounter for screening for malignant neoplasm of colon: Secondary | ICD-10-CM

## 2023-11-29 DIAGNOSIS — F419 Anxiety disorder, unspecified: Secondary | ICD-10-CM | POA: Insufficient documentation

## 2023-11-29 MED ORDER — NA SULFATE-K SULFATE-MG SULF 17.5-3.13-1.6 GM/177ML PO SOLN: 1.0000 | Freq: Once | ORAL | 0 refills | Status: AC

## 2023-11-29 NOTE — Telephone Encounter (Signed)
 Gastroenterology Pre-Procedure Review  Request Date: 02/28/24 Requesting Physician: Dr. Jinny  PATIENT REVIEW QUESTIONS: The patient responded to the following health history questions as indicated:    1. Are you having any GI issues? no 2. Do you have a personal history of Polyps? no 3. Do you have a family history of Colon Cancer or Polyps? yes (sister currently has stage 4 colon cancer) 4. Diabetes Mellitus? yes (takes Ozempic advised to stop (7) days prior on the 15th. Also takes Jardiance and has been advised to stop (3) days prior on 10/20. Advised to contact pcp regarding any concerns monitoring of blood sugars) 5. Joint replacements in the past 12 months?no 6. Major health problems in the past 3 months?no 7. Any artificial heart valves, MVP, or defibrillator?no    MEDICATIONS & ALLERGIES:    Patient reports the following regarding taking any anticoagulation/antiplatelet therapy:   Plavix, Coumadin, Eliquis, Xarelto, Lovenox, Pradaxa, Brilinta, or Effient? no Aspirin? no  Patient confirms/reports the following medications:  Current Outpatient Medications  Medication Sig Dispense Refill   empagliflozin (JARDIANCE) 10 MG TABS tablet Take 10 mg by mouth daily.     OZEMPIC, 0.25 OR 0.5 MG/DOSE, 2 MG/3ML SOPN OZEMPIC (0.25 OR 0.5 MG/DOSE) 2 MG/3ML SOPN     venlafaxine XR (EFFEXOR-XR) 150 MG 24 hr capsule Take 150 mg by mouth daily with breakfast.     No current facility-administered medications for this visit.    Patient confirms/reports the following allergies:  Allergies  Allergen Reactions   Bee Venom Anaphylaxis    SOB    No orders of the defined types were placed in this encounter.   AUTHORIZATION INFORMATION Primary Insurance: 1D#: Group #:  Secondary Insurance: 1D#: Group #:  SCHEDULE INFORMATION: Date: 02/28/24 Time: Location: [facility]

## 2024-02-28 ENCOUNTER — Telehealth: Payer: Self-pay

## 2024-02-28 ENCOUNTER — Ambulatory Visit: Admission: RE | Admit: 2024-02-28 | Source: Home / Self Care | Admitting: Gastroenterology

## 2024-02-28 SURGERY — COLONOSCOPY
Anesthesia: General

## 2024-02-28 NOTE — Telephone Encounter (Signed)
 Trish at endo said that the patient was just sent home due to her not being cleaned out... She needs to r/s colonoscopy

## 2024-04-11 ENCOUNTER — Other Ambulatory Visit: Payer: Self-pay | Admitting: Family Medicine

## 2024-04-11 DIAGNOSIS — Z1231 Encounter for screening mammogram for malignant neoplasm of breast: Secondary | ICD-10-CM
